# Patient Record
Sex: Female | Born: 1985 | Race: Black or African American | Hispanic: No | Marital: Single | Smoking: Current every day smoker
Health system: Southern US, Community
[De-identification: ages and names within clinical notes are randomized; demographics above are authoritative.]

## PROBLEM LIST (undated history)

## (undated) DIAGNOSIS — E119 Type 2 diabetes mellitus without complications: Secondary | ICD-10-CM

## (undated) HISTORY — PX: CERCLAGE REMOVAL: SUR1451

## (undated) HISTORY — PX: LAPAROSCOPIC ASSISTED VAGINAL HYSTERECTOMY: SHX5398

---

## 2015-12-01 ENCOUNTER — Encounter (HOSPITAL_COMMUNITY): Payer: Self-pay | Admitting: Neurology

## 2015-12-01 ENCOUNTER — Emergency Department (HOSPITAL_COMMUNITY): Payer: Self-pay

## 2015-12-01 ENCOUNTER — Emergency Department (HOSPITAL_COMMUNITY)
Admission: EM | Admit: 2015-12-01 | Discharge: 2015-12-01 | Disposition: A | Discharge status: Home / Self Care | Payer: Self-pay | Attending: Emergency Medicine | Admitting: Emergency Medicine

## 2015-12-01 DIAGNOSIS — F17200 Nicotine dependence, unspecified, uncomplicated: Secondary | ICD-10-CM | POA: Insufficient documentation

## 2015-12-01 DIAGNOSIS — R071 Chest pain on breathing: Secondary | ICD-10-CM | POA: Insufficient documentation

## 2015-12-01 DIAGNOSIS — R0789 Other chest pain: Secondary | ICD-10-CM

## 2015-12-01 DIAGNOSIS — Z791 Long term (current) use of non-steroidal anti-inflammatories (NSAID): Secondary | ICD-10-CM | POA: Insufficient documentation

## 2015-12-01 LAB — CBC
HCT: 38.6 % (ref 36.0–46.0)
Hemoglobin: 12.6 g/dL (ref 12.0–15.0)
MCH: 28 pg (ref 26.0–34.0)
MCHC: 32.6 g/dL (ref 30.0–36.0)
MCV: 85.8 fL (ref 78.0–100.0)
PLATELETS: 237 10*3/uL (ref 150–400)
RBC: 4.5 MIL/uL (ref 3.87–5.11)
RDW: 14.2 % (ref 11.5–15.5)
WBC: 4.1 10*3/uL (ref 4.0–10.5)

## 2015-12-01 LAB — BASIC METABOLIC PANEL
ANION GAP: 7 (ref 5–15)
BUN: 7 mg/dL (ref 6–20)
CALCIUM: 9.1 mg/dL (ref 8.9–10.3)
CO2: 27 mmol/L (ref 22–32)
CREATININE: 0.79 mg/dL (ref 0.44–1.00)
Chloride: 108 mmol/L (ref 101–111)
Glucose, Bld: 84 mg/dL (ref 65–99)
Potassium: 3.7 mmol/L (ref 3.5–5.1)
SODIUM: 142 mmol/L (ref 135–145)

## 2015-12-01 LAB — I-STAT TROPONIN, ED: TROPONIN I, POC: 0 ng/mL (ref 0.00–0.08)

## 2015-12-01 MED ORDER — NAPROXEN 500 MG PO TABS: 500.0000 mg | ORAL_TABLET | Freq: Two times a day (BID) | ORAL | Status: DC

## 2015-12-01 MED ORDER — CYCLOBENZAPRINE HCL 10 MG PO TABS: 10.0000 mg | ORAL_TABLET | Freq: Two times a day (BID) | ORAL | Status: DC | PRN

## 2015-12-01 NOTE — Discharge Instructions (Signed)
Your muscle relaxer can make you very drowsy - please do not drink or drive on this medication Follow up with PCP in 3 days for discussion of today's diagnosis.  Return to ER for any new or worsening symptoms, any additional concerns.  Chest Wall Pain Chest wall pain is pain in or around the bones and muscles of your chest. Sometimes, an injury causes this pain. Sometimes, the cause may not be known. This pain may take several weeks or longer to get better. HOME CARE INSTRUCTIONS  Pay attention to any changes in your symptoms. Take these actions to help with your pain:   Rest as told by your health care provider.   Avoid activities that cause pain. These include any activities that use your chest muscles or your abdominal and side muscles to lift heavy items.   If directed, apply ice to the painful area:  Put ice in a plastic bag.  Place a towel between your skin and the bag.  Leave the ice on for 20 minutes, 2-3 times per day.  Take over-the-counter and prescription medicines only as told by your health care provider.  Do not use tobacco products, including cigarettes, chewing tobacco, and e-cigarettes. If you need help quitting, ask your health care provider.  Keep all follow-up visits as told by your health care provider. This is important. SEEK MEDICAL CARE IF:  You have a fever.  Your chest pain becomes worse.  You have new symptoms. SEEK IMMEDIATE MEDICAL CARE IF:  You have nausea or vomiting.  You feel sweaty or light-headed.  You have a cough with phlegm (sputum) or you cough up blood.  You develop shortness of breath.   This information is not intended to replace advice given to you by your health care provider. Make sure you discuss any questions you have with your health care provider.   Document Released: 12/03/2005 Document Revised: 08/24/2015 Document Reviewed: 02/28/2015 Elsevier Interactive Patient Education [employer].

## 2015-12-01 NOTE — ED Provider Notes (Signed)
CSN: [id number]     Arrival date & time 12/01/15  1213 History   First MD Initiated Contact with Patient 12/01/15 1334     Chief Complaint  Patient presents with  . Chest Pain     (Consider location/radiation/quality/duration/timing/severity/associated sxs/prior Treatment) HPI   Kim Bauer is a 29 y.o. female  With no pertinent PMH who presents to the Emergency Department complaining of constant aching central chest pain that began last night. Denies radiation. Aggravated by movement/twisting. No alleviating factors noted, no medications taken PTA. Patient is a smoker, no other cardiac risk factors, no cardiac family history. Denies fever.   History reviewed. No pertinent past medical history. History reviewed. No pertinent past surgical history. No family history on file. Social History  Substance Use Topics  . Smoking status: Current Every Day Smoker  . Smokeless tobacco: None  . Alcohol Use: No   OB History    No data available     Review of Systems  Constitutional: Negative.   HENT: Negative for congestion, rhinorrhea and sore throat.   Eyes: Negative for visual disturbance.  Respiratory: Negative for cough, shortness of breath and wheezing.   Cardiovascular: Positive for chest pain. Negative for palpitations and leg swelling.  Gastrointestinal: Negative for nausea, vomiting, abdominal pain, diarrhea and constipation.  Musculoskeletal: Negative for back pain, neck pain and neck stiffness.  Skin: Negative for rash.  Neurological: Negative for dizziness, weakness and headaches.  Hematological: Does not bruise/bleed easily.      Allergies  Review of patient's allergies indicates no known allergies.  Home Medications   Prior to Admission medications   Medication Sig Start Date End Date Taking? Authorizing Provider  cyclobenzaprine (FLEXERIL) 10 MG tablet Take 1 tablet (10 mg total) by mouth 2 (two) times daily as needed for muscle spasms. 12/01/15   Ozella Almond Ward, PA-C  naproxen (NAPROSYN) 500 MG tablet Take 1 tablet (500 mg total) by mouth 2 (two) times daily. 12/01/15   Jaime Pilcher Ward, PA-C   BP 142/71 mmHg  Pulse 71  Temp(Src) 98.5 F (36.9 C) (Oral)  Resp 16  Ht 5\' 5"  (1.651 m)  Wt 95.255 kg  BMI 34.95 kg/m2  SpO2 100%  LMP 11/24/2015 Physical Exam  Constitutional: She is oriented to person, place, and time. She appears well-developed and well-nourished.  Alert and in no acute distress  HENT:  Head: Normocephalic and atraumatic.  Cardiovascular: Normal rate, regular rhythm, normal heart sounds and intact distal pulses.  Exam reveals no gallop and no friction rub.   No murmur heard. Pulmonary/Chest: Effort normal and breath sounds normal. No respiratory distress. She has no wheezes. She has no rales.    Abdominal: She exhibits no mass. There is no rebound and no guarding.  Abdomen soft, non-tender, non-distended Bowel sounds positive in all four quadrants  Musculoskeletal: She exhibits no edema.  Neurological: She is alert and oriented to person, place, and time.  Skin: Skin is warm and dry. No rash noted.  Psychiatric: She has a normal mood and affect. Her behavior is normal. Judgment and thought content normal.  Nursing note and vitals reviewed.   ED Course  Procedures (including critical care time) Labs Review Labs Reviewed  BASIC METABOLIC PANEL  CBC  I-STAT [place], ED    Imaging Review Dg Chest 2 View  12/01/2015  CLINICAL DATA:  Chest pain and shortness of breast since yesterday, smoker EXAM: CHEST  2 VIEW COMPARISON:  None FINDINGS: Normal heart size, mediastinal contours, and pulmonary  vascularity. Lungs clear. No pneumothorax. Bones unremarkable. IMPRESSION: Normal exam. Electronically Signed   By: Lavonia Dana M.D.   On: 12/01/2015 13:22   I have personally reviewed and evaluated these images and lab results as part of my medical decision-making.   EKG Interpretation None      MDM   Final  diagnoses:  Costochondral chest pain   Kim Bauer presents with chest pain worse with movement and tender to the touch. Negative trop, normal CXR; all labs wdl. Cardiopulm etiology unlikely.  A&P: Costochondritis  - Naproxen, muscle relaxer, symptomatic care  - PCP follow up, return precautions given.   [facility] Ward, PA-C 12/01/15 1411  Quintella Reichert, MD 12/01/15 [phone]

## 2015-12-01 NOTE — ED Notes (Signed)
Pt reports sharp cp since yesterday that got worse today. Has middle cp, is constant. Denies n/v. Denies cardiac hx.

## 2016-11-29 ENCOUNTER — Emergency Department (HOSPITAL_COMMUNITY): Payer: BLUE CROSS/BLUE SHIELD

## 2016-11-29 ENCOUNTER — Encounter (HOSPITAL_COMMUNITY): Payer: Self-pay

## 2016-11-29 ENCOUNTER — Emergency Department (HOSPITAL_COMMUNITY)
Admission: EM | Admit: 2016-11-29 | Discharge: 2016-11-29 | Disposition: A | Discharge status: Home / Self Care | Payer: BLUE CROSS/BLUE SHIELD | Attending: Emergency Medicine | Admitting: Emergency Medicine

## 2016-11-29 DIAGNOSIS — Y92410 Unspecified street and highway as the place of occurrence of the external cause: Secondary | ICD-10-CM | POA: Insufficient documentation

## 2016-11-29 DIAGNOSIS — R0789 Other chest pain: Secondary | ICD-10-CM | POA: Diagnosis not present

## 2016-11-29 DIAGNOSIS — Z23 Encounter for immunization: Secondary | ICD-10-CM | POA: Diagnosis not present

## 2016-11-29 DIAGNOSIS — M25512 Pain in left shoulder: Secondary | ICD-10-CM | POA: Diagnosis not present

## 2016-11-29 DIAGNOSIS — Y939 Activity, unspecified: Secondary | ICD-10-CM | POA: Insufficient documentation

## 2016-11-29 DIAGNOSIS — S60811A Abrasion of right wrist, initial encounter: Secondary | ICD-10-CM | POA: Insufficient documentation

## 2016-11-29 DIAGNOSIS — M545 Low back pain: Secondary | ICD-10-CM | POA: Insufficient documentation

## 2016-11-29 DIAGNOSIS — Y999 Unspecified external cause status: Secondary | ICD-10-CM | POA: Diagnosis not present

## 2016-11-29 LAB — I-STAT BETA HCG BLOOD, ED (MC, WL, AP ONLY)

## 2016-11-29 MED ORDER — NAPROXEN 500 MG PO TABS: 500.0000 mg | ORAL_TABLET | Freq: Two times a day (BID) | ORAL | 0 refills | Status: DC

## 2016-11-29 MED ORDER — CYCLOBENZAPRINE HCL 5 MG PO TABS: 5.0000 mg | ORAL_TABLET | Freq: Three times a day (TID) | ORAL | 0 refills | Status: DC | PRN

## 2016-11-29 MED ORDER — CYCLOBENZAPRINE HCL 10 MG PO TABS
5.0000 mg | ORAL_TABLET | Freq: Once | ORAL | Status: AC
  Administered 2016-11-29: 5 mg via ORAL
  Filled 2016-11-29: qty 1

## 2016-11-29 MED ORDER — TETANUS-DIPHTH-ACELL PERTUSSIS 5-2.5-18.5 LF-MCG/0.5 IM SUSP
0.5000 mL | Freq: Once | INTRAMUSCULAR | Status: AC
  Administered 2016-11-29: 0.5 mL via INTRAMUSCULAR
  Filled 2016-11-29: qty 0.5

## 2016-11-29 MED ORDER — ACETAMINOPHEN 325 MG PO TABS
650.0000 mg | ORAL_TABLET | Freq: Once | ORAL | Status: AC
  Administered 2016-11-29: 650 mg via ORAL
  Filled 2016-11-29: qty 2
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
## 2016-11-29 NOTE — Discharge Instructions (Signed)
Read the information below.  Your x-rays were re-assuring. You were provided a knee sleeve and ankle brace and crutches for symptomatic relief. Use for the next 2-3 days. You may feel sore for the next 2-3 days. I have prescribed naprosyn and flexeril for relief. While taking naprosyn do not take other NSAIDs (ibuprofen, motrin, or aleve). Flexeril can make you drowsy, do not drive after taking.  You can apply heat/ice to affected areas for 20 minute increments.  Warm showers can soothe sore muscles.  If symptoms persist for more than a week follow up with your primary provider. I have provided the contact information for [facility] and [facility] if you do not have a regular provider.  Use the prescribed medication as directed.  Please discuss all new medications with your pharmacist.   You may return to the Emergency Department at any time for worsening condition or any new symptoms that concern you.

## 2017-12-03 NOTE — H&P (Signed)
Patient name Kim Bauer, Kim Bauer DICTATION# [id number] CSN# [id number]  [facility], MD 12/03/2017 5:06 AM

## 2017-12-04 NOTE — H&P (Signed)
Kim Bauer, ROBBEN NO.:  [id number]  MEDICAL RECORD NO.:  [id number]  LOCATION:                                 FACILITY:  PHYSICIAN:  Darlyn Chamber, M.D.        DATE OF BIRTH:  DATE OF ADMISSION: DATE OF DISCHARGE:                             HISTORY & PHYSICAL   DATE OF SURGERY:  December 23, 2017, at [facility] here in [place].  HISTORY OF PRESENT ILLNESS:  In relation to the present admission, the patient is a 31 year old, gravida 3, para 2 female, presents for laparoscopic-assisted vaginal hysterectomy.  In relation to the present admission, the patient has a history of abnormal cervical cytology.  She had previous cryotherapy.  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  Colposcopic-directed biopsy at that time revealed moderate dysplasia with high-grade lesions.  She underwent LEEP of the cervix in July 2017, that revealed koilocytic atypia. Subsequently, again, she had recurrent abnormal Pap smear with severe changes.  Biopsies came back completely negative.  We repeated the LEEP, no dysplasia was noted.  This was back earlier this year.  They reviewed the whole specimen, could not come with anything that was concomitant with cytology.  Subsequently, again, she had abnormal Pap smear later this year.  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  We repeated the LEEP on her that revealed diffuse low-grade dysplasia.  In view of the persistent abnormalities and the lack of concurrence between the cytology and histology, we decided to proceed with laparoscopic-assisted vaginal hysterectomy.  Other alternatives such as watching certainly have been discussed.  ALLERGIES:  In terms of allergies, she has no known drug allergies.  MEDICATIONS:  None.  PAST MEDICAL HISTORY:  Usual childhood diseases.  No significant sequelae.  She has had 2 vaginal deliveries.  SOCIAL HISTORY:  No tobacco or  alcohol use.  FAMILY HISTORY:  Noncontributory.  REVIEW OF SYSTEMS:  Noncontributory.  PHYSICAL EXAMINATION:  VITAL SIGNS:  The patient is afebrile.  Stable vital signs.  HEENT:  The patient is normocephalic.  Pupils equal, round, and reactive to light and accommodation.  Extraocular movements are intact.  Sclerae and conjunctivae clear.  Oropharynx clear. NECK:  Without thyromegaly. BREASTS:  No discrete masses. LUNGS:  Clear. CARDIOVASCULAR SYSTEM:  Regular rate without murmurs or gallops.  No carotid or abdominal bruits. ABDOMEN:  Benign.  No mass, organomegaly, or tenderness. PELVIC:  Normal external genitalia.  Vaginal mucosa is clear.  Cervix unremarkable.  Uterus of normal size, shape, and contour.  Adnexa free of mass or tenderness. EXTREMITIES:  Trace edema. NEUROLOGIC:  Grossly within normal limits.  IMPRESSION:  Persistent abnormal cervical cytology despite numerous management.  PLAN:  The patient will undergo a laparoscopic-assisted vaginal hysterectomy with removal of both fallopian tubes.  The risks of surgery have been discussed including the risk of infection.  Risk of hemorrhage that could require transfusion with the risk of AIDS or hepatitis.  Risk of injury to adjacent organs including bladder, bowel, ureters that could require further exploratory surgery.  Risk of deep venous thrombosis and pulmonary embolus.  The patient expressed understanding of potential risks and complications.  Darlyn Chamber, M.D.     JSM/MEDQ  D:  12/03/2017  T:  12/04/2017  Job:  [id number]

## 2017-12-13 ENCOUNTER — Encounter (HOSPITAL_COMMUNITY): Payer: Self-pay

## 2017-12-13 NOTE — Patient Instructions (Signed)
Kim Bauer  12/13/2017      Your procedure is scheduled on:  Monday, Jan. 7, 2019   Report to [facility]  At  5:30 A.M.   Call this number if you have problems the morning of [phone]              OUR ADDRESS IS [address] , WE ARE LOCATED IN [place].    Remember:  Do not eat food or drink liquids after midnight.   Take these medicines the morning of surgery with A SIP OF WATER  None   Do not wear jewelry, make-up or nail polish.  Do not wear lotions, powders, or perfumes, or deoderant.  Do not shave 48 hours prior to surgery.    Do not bring valuables to the hospital.  [facility] is not responsible for any belongings or valuables.  Contacts, dentures or bridgework may not be worn into surgery.  Leave your suitcase in the car.  After surgery it may be brought to your room.  For patients admitted to the hospital, discharge time will be determined by your treatment team.   Special instructions:    Please read over the following fact sheets that you were given.    [facility] - Preparing for Surgery Before surgery, you can play an important role.  Because skin is not sterile, your skin needs to be as free of germs as possible.  You can reduce the number of germs on your skin by washing with CHG (chlorahexidine gluconate) soap before surgery.  CHG is an antiseptic cleaner which kills germs and bonds with the skin to continue killing germs even after washing. Please DO NOT use if you have an allergy to CHG or antibacterial soaps.  If your skin becomes reddened/irritated stop using the CHG and inform your nurse when you arrive at Short Stay. Do not shave (including legs and underarms) for at least 48 hours prior to the first CHG shower.  You may shave your face/neck.  Please follow these instructions carefully:  1.  Shower with CHG Soap the night before surgery and the  morning of surgery.  2.  If you  choose to wash your hair, wash your hair first as usual with your normal  shampoo.  3.  After you shampoo, rinse your hair and body thoroughly to remove the shampoo.                             4.  Use CHG as you would any other liquid soap.  You can apply chg directly to the skin and wash.  Gently with a scrungie or clean washcloth.  5.  Apply the CHG Soap to your body ONLY FROM THE NECK DOWN.   Do   not use on face/ open                           Wound or open sores. Avoid contact with eyes, ears mouth and   genitals (private parts).                       Wash face,  Genitals (private parts) with your normal soap.             6.  Wash thoroughly, paying  special attention to the area where your    surgery  will be performed.  7.  Thoroughly rinse your body with warm water from the neck down.  8.  DO NOT shower/wash with your normal soap after using and rinsing off the CHG Soap.                9.  Pat yourself dry with a clean towel.            10.  Wear clean pajamas.            11.  Place clean sheets on your bed the night of your first shower and do not  sleep with pets. Day of Surgery : Do not apply any lotions/deodorants the morning of surgery.  Please wear clean clothes to the hospital/surgery center.  FAILURE TO FOLLOW THESE INSTRUCTIONS MAY RESULT IN THE CANCELLATION OF YOUR SURGERY  PATIENT SIGNATURE_________________________________  NURSE SIGNATURE__________________________________  ________________________________________________________________________   Adam Phenix  An incentive spirometer is a tool that can help keep your lungs clear and active. This tool measures how well you are filling your lungs with each breath. Taking long deep breaths may help reverse or decrease the chance of developing breathing (pulmonary) problems (especially infection) following:  A long period of time when you are unable to move or be active. BEFORE THE PROCEDURE   If the spirometer  includes an indicator to show your best effort, your nurse or respiratory therapist will set it to a desired goal.  If possible, sit up straight or lean slightly forward. Try not to slouch.  Hold the incentive spirometer in an upright position. INSTRUCTIONS FOR USE  1. Sit on the edge of your bed if possible, or sit up as far as you can in bed or on a chair. 2. Hold the incentive spirometer in an upright position. 3. Breathe out normally. 4. Place the mouthpiece in your mouth and seal your lips tightly around it. 5. Breathe in slowly and as deeply as possible, raising the piston or the ball toward the top of the column. 6. Hold your breath for 3-5 seconds or for as long as possible. Allow the piston or ball to fall to the bottom of the column. 7. Remove the mouthpiece from your mouth and breathe out normally. 8. Rest for a few seconds and repeat Steps 1 through 7 at least 10 times every 1-2 hours when you are awake. Take your time and take a few normal breaths between deep breaths. 9. The spirometer may include an indicator to show your best effort. Use the indicator as a goal to work toward during each repetition. 10. After each set of 10 deep breaths, practice coughing to be sure your lungs are clear. If you have an incision (the cut made at the time of surgery), support your incision when coughing by placing a pillow or rolled up towels firmly against it. Once you are able to get out of bed, walk around indoors and cough well. You may stop using the incentive spirometer when instructed by your caregiver.  RISKS AND COMPLICATIONS  Take your time so you do not get dizzy or light-headed.  If you are in pain, you may need to take or ask for pain medication before doing incentive spirometry. It is harder to take a deep breath if you are having pain. AFTER USE  Rest and breathe slowly and easily.  It can be helpful to keep track of a log of your progress. Your  caregiver can provide you with a  simple table to help with this. If you are using the spirometer at home, follow these instructions: [place] IF:   You are having difficultly using the spirometer.  You have trouble using the spirometer as often as instructed.  Your pain medication is not giving enough relief while using the spirometer.  You develop fever of 100.5 F (38.1 C) or higher. SEEK IMMEDIATE MEDICAL CARE IF:   You cough up bloody sputum that had not been present before.  You develop fever of 102 F (38.9 C) or greater.  You develop worsening pain at or near the incision site. MAKE SURE YOU:   Understand these instructions.  Will watch your condition.  Will get help right away if you are not doing well or get worse. Document Released: 04/15/2007 Document Revised: 02/25/2012 Document Reviewed: 06/16/2007 Exit[employer] Patient Information 2014 [employer], [place].   ________________________________________________________________________  WHAT IS A BLOOD TRANSFUSION? Blood Transfusion Information  A transfusion is the replacement of blood or some of its parts. Blood is made up of multiple cells which provide different functions.  Red blood cells carry oxygen and are used for blood loss replacement.  White blood cells fight against infection.  Platelets control bleeding.  Plasma helps clot blood.  Other blood products are available for specialized needs, such as hemophilia or other clotting disorders. BEFORE THE TRANSFUSION  Who gives blood for transfusions?   Healthy volunteers who are fully evaluated to make sure their blood is safe. This is blood bank blood. Transfusion therapy is the safest it has ever been in the practice of medicine. Before blood is taken from a donor, a complete history is taken to make sure that person has no history of diseases nor engages in risky social behavior (examples are intravenous drug use or sexual activity with multiple partners). The donor's travel history  is screened to minimize risk of transmitting infections, such as malaria. The donated blood is tested for signs of infectious diseases, such as HIV and hepatitis. The blood is then tested to be sure it is compatible with you in order to minimize the chance of a transfusion reaction. If you or a relative donates blood, this is often done in anticipation of surgery and is not appropriate for emergency situations. It takes many days to process the donated blood. RISKS AND COMPLICATIONS Although transfusion therapy is very safe and saves many lives, the main dangers of transfusion include:   Getting an infectious disease.  Developing a transfusion reaction. This is an allergic reaction to something in the blood you were given. Every precaution is taken to prevent this. The decision to have a blood transfusion has been considered carefully by your caregiver before blood is given. Blood is not given unless the benefits outweigh the risks. AFTER THE TRANSFUSION  Right after receiving a blood transfusion, you will usually feel much better and more energetic. This is especially true if your red blood cells have gotten low (anemic). The transfusion raises the level of the red blood cells which carry oxygen, and this usually causes an energy increase.  The nurse administering the transfusion will monitor you carefully for complications. HOME CARE INSTRUCTIONS  No special instructions are needed after a transfusion. You may find your energy is better. Speak with your caregiver about any limitations on activity for underlying diseases you may have. SEEK MEDICAL CARE IF:   Your condition is not improving after your transfusion.  You develop redness or irritation at  the intravenous (IV) site. SEEK IMMEDIATE MEDICAL CARE IF:  Any of the following symptoms occur over the next 12 hours:  Shaking chills.  You have a temperature by mouth above 102 F (38.9 C), not controlled by medicine.  Chest, back, or  muscle pain.  People around you feel you are not acting correctly or are confused.  Shortness of breath or difficulty breathing.  Dizziness and fainting.  You get a rash or develop hives.  You have a decrease in urine output.  Your urine turns a dark color or changes to pink, red, or brown. Any of the following symptoms occur over the next 10 days:  You have a temperature by mouth above 102 F (38.9 C), not controlled by medicine.  Shortness of breath.  Weakness after normal activity.  The white part of the eye turns yellow (jaundice).  You have a decrease in the amount of urine or are urinating less often.  Your urine turns a dark color or changes to pink, red, or brown. Document Released: 11/30/2000 Document Revised: 02/25/2012 Document Reviewed: 07/19/2008 [facility] Patient Information 2014 [place], [place].  _______________________________________________________________________

## 2017-12-18 ENCOUNTER — Encounter (HOSPITAL_COMMUNITY)
Admission: RE | Admit: 2017-12-18 | Discharge: 2017-12-18 | Disposition: A | Discharge status: Home / Self Care | Payer: BLUE CROSS/BLUE SHIELD | Source: Ambulatory Visit | Attending: Obstetrics and Gynecology | Admitting: Obstetrics and Gynecology

## 2017-12-18 NOTE — Pre-Procedure Instructions (Signed)
Left message on Ms. Kim Bauer voicemail to check the status of her arrival to her pre op appointment today.

## 2017-12-19 ENCOUNTER — Encounter (HOSPITAL_COMMUNITY): Payer: Self-pay

## 2017-12-19 ENCOUNTER — Other Ambulatory Visit: Payer: Self-pay

## 2017-12-19 ENCOUNTER — Encounter (HOSPITAL_COMMUNITY)
Admission: RE | Admit: 2017-12-19 | Discharge: 2017-12-19 | Disposition: A | Discharge status: Home / Self Care | Payer: BLUE CROSS/BLUE SHIELD | Source: Ambulatory Visit | Attending: Obstetrics and Gynecology | Admitting: Obstetrics and Gynecology

## 2017-12-19 DIAGNOSIS — N879 Dysplasia of cervix uteri, unspecified: Secondary | ICD-10-CM | POA: Insufficient documentation

## 2017-12-19 DIAGNOSIS — Z01812 Encounter for preprocedural laboratory examination: Secondary | ICD-10-CM | POA: Diagnosis present

## 2017-12-19 LAB — CBC
HCT: 38.1 % (ref 36.0–46.0)
HEMOGLOBIN: 12.5 g/dL (ref 12.0–15.0)
MCH: 28.4 pg (ref 26.0–34.0)
MCHC: 32.8 g/dL (ref 30.0–36.0)
MCV: 86.6 fL (ref 78.0–100.0)
Platelets: 241 10*3/uL (ref 150–400)
RBC: 4.4 MIL/uL (ref 3.87–5.11)
RDW: 14.3 % (ref 11.5–15.5)
WBC: 5 10*3/uL (ref 4.0–10.5)

## 2017-12-19 LAB — ABO/RH: ABO/RH(D): A POS

## 2017-12-19 NOTE — Patient Instructions (Signed)
Kim Bauer  12/19/2017      Your procedure is scheduled on 12-23-17  Report to [facility]  Call this number if you have problems the morning of surgery:[phone]             OUR ADDRESS IS [address] , WE ARE LOCATED IN [place].    Remember:  Do not eat food or drink liquids after midnight.    Take these medicines the morning of surgery with A SIP OF WATER  None                      Kim Bauer - Preparing for Surgery  Before surgery, you can play an important role.  Because skin is not sterile, your skin needs to be as free of germs as possible.  You can reduce the number of germs on your skin by washing with CHG (chlorahexidine gluconate) soap before surgery.  CHG is an antiseptic cleaner which kills germs and bonds with the skin to continue killing germs even after washing. Please DO NOT use if you have an allergy to CHG or antibacterial soaps.  If your skin becomes reddened/irritated stop using the CHG and inform your nurse when you arrive at Short Stay. Do not shave (including legs and underarms) for at least 48 hours prior to the first CHG shower.  You may shave your face/neck. Please follow these instructions carefully:  1.  Shower with CHG Soap the night before surgery and the  morning of Surgery.  2.  If you choose to wash your hair, wash your hair first as usual with your  normal  shampoo.  3.  After you shampoo, rinse your hair and body thoroughly to remove the  shampoo.                           4.  Use CHG as you would any other liquid soap.  You can apply chg directly  to the skin and wash                       Gently with a scrungie or clean washcloth.  5.  Apply the CHG Soap to your body ONLY FROM THE NECK DOWN.   Do not use on face/ open                           Wound or open sores. Avoid contact with eyes, ears mouth and genitals (private parts).                       Wash face,  Genitals  (private parts) with your normal soap.             6.  Wash thoroughly, paying special attention to the area where your surgery  will be performed.  7.  Thoroughly rinse your body with warm water from the neck down.  8.  DO NOT shower/wash with your normal soap after using and rinsing off  the CHG Soap.                9.  Pat yourself dry with a clean towel.            10.  Wear clean pajamas.  11.  Place clean sheets on your bed the night of your first shower and do not  sleep with pets. Day of Surgery : Do not apply any lotions/deodorants the morning of surgery.  Please wear clean clothes to the hospital/surgery center.  FAILURE TO FOLLOW THESE INSTRUCTIONS MAY RESULT IN THE CANCELLATION OF YOUR SURGERY PATIENT SIGNATURE_________________________________  NURSE SIGNATURE__________________________________  ________________________________________________________________________   Do not wear jewelry, make-up or nail polish.  Do not wear lotions, powders, or perfumes, or deoderant.  Do not shave 48 hours prior to surgery.  Men may shave face and neck.  Do not bring valuables to the hospital.  [facility] is not responsible for any belongings or valuables.  Contacts, dentures or bridgework may not be worn into surgery.  Leave your suitcase in the car.  After surgery it may be brought to your room.  For patients admitted to the hospital, discharge time will be determined by your treatment team.   Special instructions:   Please read over the following fact sheets that you were given.

## 2017-12-23 ENCOUNTER — Ambulatory Visit (HOSPITAL_BASED_OUTPATIENT_CLINIC_OR_DEPARTMENT_OTHER): Payer: BLUE CROSS/BLUE SHIELD | Admitting: Anesthesiology

## 2017-12-23 ENCOUNTER — Other Ambulatory Visit: Payer: Self-pay

## 2017-12-23 ENCOUNTER — Encounter (HOSPITAL_BASED_OUTPATIENT_CLINIC_OR_DEPARTMENT_OTHER): Payer: Self-pay | Admitting: *Deleted

## 2017-12-23 ENCOUNTER — Observation Stay (HOSPITAL_BASED_OUTPATIENT_CLINIC_OR_DEPARTMENT_OTHER)
Admission: RE | Admit: 2017-12-23 | Discharge: 2017-12-24 | Disposition: A | Discharge status: Home / Self Care | Payer: BLUE CROSS/BLUE SHIELD | Source: Ambulatory Visit | Attending: Obstetrics and Gynecology | Admitting: Obstetrics and Gynecology

## 2017-12-23 ENCOUNTER — Encounter (HOSPITAL_BASED_OUTPATIENT_CLINIC_OR_DEPARTMENT_OTHER)
Admission: RE | Disposition: A | Discharge status: Home / Self Care | Payer: Self-pay | Source: Ambulatory Visit | Attending: Obstetrics and Gynecology

## 2017-12-23 DIAGNOSIS — N879 Dysplasia of cervix uteri, unspecified: Secondary | ICD-10-CM | POA: Diagnosis present

## 2017-12-23 DIAGNOSIS — Z90710 Acquired absence of both cervix and uterus: Secondary | ICD-10-CM

## 2017-12-23 DIAGNOSIS — D282 Benign neoplasm of uterine tubes and ligaments: Secondary | ICD-10-CM | POA: Insufficient documentation

## 2017-12-23 DIAGNOSIS — N871 Moderate cervical dysplasia: Principal | ICD-10-CM | POA: Insufficient documentation

## 2017-12-23 HISTORY — PX: LAPAROSCOPIC VAGINAL HYSTERECTOMY WITH SALPINGECTOMY: SHX6680

## 2017-12-23 HISTORY — DX: Acquired absence of both cervix and uterus: Z90.710

## 2017-12-23 LAB — TYPE AND SCREEN
ABO/RH(D): A POS
ANTIBODY SCREEN: NEGATIVE

## 2017-12-23 LAB — HCG, SERUM, QUALITATIVE: PREG SERUM: NEGATIVE

## 2017-12-23 SURGERY — HYSTERECTOMY, VAGINAL, LAPAROSCOPY-ASSISTED, WITH SALPINGECTOMY
Anesthesia: General | Laterality: Bilateral

## 2017-12-23 MED ORDER — MEPERIDINE HCL 25 MG/ML IJ SOLN
6.2500 mg | INTRAMUSCULAR | Status: DC | PRN
  Filled 2017-12-23: qty 1

## 2017-12-23 MED ORDER — BUPIVACAINE HCL (PF) 0.25 % IJ SOLN
INTRAMUSCULAR | Status: DC | PRN
  Administered 2017-12-23: 7 mL

## 2017-12-23 MED ORDER — ONDANSETRON HCL 4 MG/2ML IJ SOLN
INTRAMUSCULAR | Status: AC
  Filled 2017-12-23: qty 2

## 2017-12-23 MED ORDER — MIDAZOLAM HCL 2 MG/2ML IJ SOLN
0.5000 mg | Freq: Once | INTRAMUSCULAR | Status: DC | PRN
  Filled 2017-12-23: qty 2

## 2017-12-23 MED ORDER — FENTANYL CITRATE (PF) 100 MCG/2ML IJ SOLN
INTRAMUSCULAR | Status: DC | PRN
  Administered 2017-12-23 (×3): 50 ug via INTRAVENOUS
  Administered 2017-12-23: 100 ug via INTRAVENOUS
  Administered 2017-12-23: 25 ug via INTRAVENOUS

## 2017-12-23 MED ORDER — ONDANSETRON HCL 4 MG/2ML IJ SOLN
4.0000 mg | Freq: Four times a day (QID) | INTRAMUSCULAR | Status: DC | PRN
  Filled 2017-12-23: qty 2

## 2017-12-23 MED ORDER — FENTANYL CITRATE (PF) 100 MCG/2ML IJ SOLN
INTRAMUSCULAR | Status: AC
  Filled 2017-12-23: qty 2

## 2017-12-23 MED ORDER — DEXAMETHASONE SODIUM PHOSPHATE 10 MG/ML IJ SOLN
INTRAMUSCULAR | Status: DC | PRN
  Administered 2017-12-23: 10 mg via INTRAVENOUS

## 2017-12-23 MED ORDER — SUGAMMADEX SODIUM 200 MG/2ML IV SOLN
INTRAVENOUS | Status: AC
Start: 2017-12-23 — End: 2017-12-23
  Filled 2017-12-23: qty 2

## 2017-12-23 MED ORDER — PROMETHAZINE HCL 25 MG/ML IJ SOLN
6.2500 mg | INTRAMUSCULAR | Status: AC | PRN
  Administered 2017-12-23 (×2): 6.25 mg via INTRAVENOUS
  Filled 2017-12-23: qty 1

## 2017-12-23 MED ORDER — LIDOCAINE 2% (20 MG/ML) 5 ML SYRINGE
INTRAMUSCULAR | Status: DC | PRN
  Administered 2017-12-23: 40 mg via INTRAVENOUS

## 2017-12-23 MED ORDER — OXYCODONE-ACETAMINOPHEN 5-325 MG PO TABS
1.0000 | ORAL_TABLET | ORAL | Status: DC | PRN
  Administered 2017-12-23 – 2017-12-24 (×3): 1 via ORAL
  Filled 2017-12-23: qty 1

## 2017-12-23 MED ORDER — SUGAMMADEX SODIUM 200 MG/2ML IV SOLN
INTRAVENOUS | Status: DC | PRN
  Administered 2017-12-23: 10 mg via INTRAVENOUS

## 2017-12-23 MED ORDER — PROMETHAZINE HCL 25 MG/ML IJ SOLN
INTRAMUSCULAR | Status: AC
  Filled 2017-12-23: qty 1

## 2017-12-23 MED ORDER — KETOROLAC TROMETHAMINE 30 MG/ML IJ SOLN
INTRAMUSCULAR | Status: AC
  Filled 2017-12-23: qty 1

## 2017-12-23 MED ORDER — LACTATED RINGERS IV SOLN
INTRAVENOUS | Status: DC
  Administered 2017-12-23 – 2017-12-24 (×3): via INTRAVENOUS
  Filled 2017-12-23 (×4): qty 1000

## 2017-12-23 MED ORDER — NALOXONE HCL 0.4 MG/ML IJ SOLN
0.4000 mg | INTRAMUSCULAR | Status: DC | PRN
  Filled 2017-12-23: qty 1

## 2017-12-23 MED ORDER — LIDOCAINE 2% (20 MG/ML) 5 ML SYRINGE
INTRAMUSCULAR | Status: AC
Start: 2017-12-23 — End: 2017-12-23
  Filled 2017-12-23: qty 5

## 2017-12-23 MED ORDER — ROCURONIUM BROMIDE 50 MG/5ML IV SOSY
PREFILLED_SYRINGE | INTRAVENOUS | Status: AC
  Filled 2017-12-23: qty 5

## 2017-12-23 MED ORDER — SCOPOLAMINE 1 MG/3DAYS TD PT72SCOPOLAMINE 1 MG/3DAYS
1.0000 | MEDICATED_PATCH | Freq: Once | TRANSDERMAL | Status: AC
Start: 2017-12-23 — End: 2017-12-23
  Administered 2017-12-23: 1 via TRANSDERMAL
  Filled 2017-12-23: qty 1

## 2017-12-23 MED ORDER — MIDAZOLAM HCL 2 MG/2ML IJ SOLN
INTRAMUSCULAR | Status: AC
  Filled 2017-12-23: qty 2

## 2017-12-23 MED ORDER — MENTHOL 3 MG MT LOZG
1.0000 | LOZENGE | OROMUCOSAL | Status: DC | PRN
  Filled 2017-12-23: qty 9

## 2017-12-23 MED ORDER — PROPOFOL 10 MG/ML IV BOLUS
INTRAVENOUS | Status: DC | PRN
  Administered 2017-12-23: 190 mg via INTRAVENOUS

## 2017-12-23 MED ORDER — HYDROMORPHONE HCL 1 MG/ML IJ SOLN
0.2500 mg | INTRAMUSCULAR | Status: DC | PRN
  Administered 2017-12-23 (×2): 0.5 mg via INTRAVENOUS
  Filled 2017-12-23: qty 0.5

## 2017-12-23 MED ORDER — ONDANSETRON HCL 4 MG/2ML IJ SOLN
INTRAMUSCULAR | Status: DC | PRN
  Administered 2017-12-23: 4 mg via INTRAVENOUS

## 2017-12-23 MED ORDER — SCOPOLAMINE 1 MG/3DAYS TD PT72
MEDICATED_PATCH | TRANSDERMAL | Status: AC
  Filled 2017-12-23: qty 1

## 2017-12-23 MED ORDER — HYDROMORPHONE HCL 1 MG/ML IJ SOLN
INTRAMUSCULAR | Status: AC
  Filled 2017-12-23: qty 1

## 2017-12-23 MED ORDER — ACETAMINOPHEN 325 MG PO TABS
650.0000 mg | ORAL_TABLET | ORAL | Status: DC | PRN
  Filled 2017-12-23: qty 2

## 2017-12-23 MED ORDER — LACTATED RINGERS IV SOLN
INTRAVENOUS | Status: DC
  Administered 2017-12-23 (×2): via INTRAVENOUS
  Filled 2017-12-23: qty 1000

## 2017-12-23 MED ORDER — DEXAMETHASONE SODIUM PHOSPHATE 10 MG/ML IJ SOLN
INTRAMUSCULAR | Status: AC
  Filled 2017-12-23: qty 1

## 2017-12-23 MED ORDER — OXYCODONE-ACETAMINOPHEN 5-325 MG PO TABS
ORAL_TABLET | ORAL | Status: AC
  Filled 2017-12-23: qty 1

## 2017-12-23 MED ORDER — ONDANSETRON HCL 4 MG PO TABS
4.0000 mg | ORAL_TABLET | Freq: Four times a day (QID) | ORAL | Status: DC | PRN
  Filled 2017-12-23: qty 1

## 2017-12-23 MED ORDER — ROCURONIUM BROMIDE 50 MG/5ML IV SOSY
PREFILLED_SYRINGE | INTRAVENOUS | Status: DC | PRN
  Administered 2017-12-23: 50 mg via INTRAVENOUS
  Administered 2017-12-23: 10 mg via INTRAVENOUS

## 2017-12-23 MED ORDER — SODIUM CHLORIDE 0.9% FLUSH
9.0000 mL | INTRAVENOUS | Status: DC | PRN
  Filled 2017-12-23: qty 10

## 2017-12-23 MED ORDER — SODIUM CHLORIDE 0.9 % IJ SOLN
INTRAMUSCULAR | Status: AC
  Filled 2017-12-23: qty 10

## 2017-12-23 MED ORDER — FENTANYL CITRATE (PF) 250 MCG/5ML IJ SOLN
INTRAMUSCULAR | Status: AC
  Filled 2017-12-23: qty 5

## 2017-12-23 MED ORDER — CEFAZOLIN SODIUM-DEXTROSE 2-4 GM/100ML-% IV SOLN
2.0000 g | INTRAVENOUS | Status: AC
  Administered 2017-12-23: 2 g via INTRAVENOUS
  Filled 2017-12-23: qty 100

## 2017-12-23 MED ORDER — CEFAZOLIN SODIUM-DEXTROSE 2-4 GM/100ML-% IV SOLN
INTRAVENOUS | Status: AC
  Filled 2017-12-23: qty 100

## 2017-12-23 MED ORDER — PROPOFOL 10 MG/ML IV BOLUS
INTRAVENOUS | Status: AC
  Filled 2017-12-23: qty 20

## 2017-12-23 MED ORDER — KETOROLAC TROMETHAMINE 30 MG/ML IJ SOLN
INTRAMUSCULAR | Status: DC | PRN
  Administered 2017-12-23: 30 mg via INTRAVENOUS

## 2017-12-23 MED ORDER — MIDAZOLAM HCL 5 MG/5ML IJ SOLN
INTRAMUSCULAR | Status: DC | PRN
  Administered 2017-12-23: 2 mg via INTRAVENOUS

## 2017-12-23 MED ORDER — ONDANSETRON HCL 4 MG/2ML IJ SOLN
4.0000 mg | Freq: Four times a day (QID) | INTRAMUSCULAR | Status: DC | PRN
  Administered 2017-12-23: 4 mg via INTRAVENOUS
  Filled 2017-12-23: qty 2

## 2017-12-23 SURGICAL SUPPLY — 64 items
BAG RETRIEVAL 10 (BASKET)
BAG RETRIEVAL 10MM (BASKET)
BLADE CLIPPER SURG (BLADE) IMPLANT
CANISTER SUCT 3000ML PPV (MISCELLANEOUS) ×6 IMPLANT
CATH ROBINSON RED A/P 16FR (CATHETERS) ×3 IMPLANT
CLOSURE WOUND 1/4X4 (GAUZE/BANDAGES/DRESSINGS)
COVER BACK TABLE 60X90IN (DRAPES) ×3 IMPLANT
COVER MAYO STAND STRL (DRAPES) ×6 IMPLANT
COVER SURGICAL LIGHT HANDLE (MISCELLANEOUS) ×6 IMPLANT
DERMABOND ADVANCED (GAUZE/BANDAGES/DRESSINGS) ×4
DERMABOND ADVANCED .7 DNX12 (GAUZE/BANDAGES/DRESSINGS) ×2 IMPLANT
DRSG COVADERM PLUS 2X2 (GAUZE/BANDAGES/DRESSINGS) IMPLANT
DRSG OPSITE POSTOP 3X4 (GAUZE/BANDAGES/DRESSINGS) ×3 IMPLANT
DURAPREP 26ML APPLICATOR (WOUND CARE) ×3 IMPLANT
ELECT REM PT RETURN 9FT ADLT (ELECTROSURGICAL) ×3
ELECTRODE REM PT RTRN 9FT ADLT (ELECTROSURGICAL) ×1 IMPLANT
GLOVE BIO SURGEON STRL SZ 6.5 (GLOVE) ×2 IMPLANT
GLOVE BIO SURGEON STRL SZ7 (GLOVE) ×12 IMPLANT
GLOVE BIO SURGEONS STRL SZ 6.5 (GLOVE) ×1
GLOVE ECLIPSE 6.5 STRL STRAW (GLOVE) ×3 IMPLANT
GLOVE ECLIPSE 7.5 STRL STRAW (GLOVE) ×3 IMPLANT
HOLDER FOLEY CATH W/STRAP (MISCELLANEOUS) ×3 IMPLANT
KIT RM TURNOVER CYSTO AR (KITS) ×3 IMPLANT
LEGGING LITHOTOMY PAIR STRL (DRAPES) ×3 IMPLANT
NEEDLE HYPO 22GX1.5 SAFETY (NEEDLE) ×3 IMPLANT
NEEDLE INSUFFLATION 14GA 120MM (NEEDLE) ×3 IMPLANT
NS IRRIG 500ML POUR BTL (IV SOLUTION) ×3 IMPLANT
PACK LAVH (CUSTOM PROCEDURE TRAY) ×3 IMPLANT
PACK ROBOTIC GOWN (GOWN DISPOSABLE) IMPLANT
PACK TRENDGUARD 450 HYBRID PRO (MISCELLANEOUS) ×1 IMPLANT
PAD OB MATERNITY 4.3X12.25 (PERSONAL CARE ITEMS) ×3 IMPLANT
PAD PREP 24X48 CUFFED NSTRL (MISCELLANEOUS) ×3 IMPLANT
SCISSORS LAP 5X45 EPIX DISP (ENDOMECHANICALS) IMPLANT
SEALER TISSUE G2 CVD JAW 45CM (ENDOMECHANICALS) ×3 IMPLANT
SET IRRIG TUBING LAPAROSCOPIC (IRRIGATION / IRRIGATOR) ×3 IMPLANT
SET IRRIG Y TYPE TUR BLADDER L (SET/KITS/TRAYS/PACK) IMPLANT
SOLUTION ELECTROLUBE (MISCELLANEOUS) IMPLANT
STRIP CLOSURE SKIN 1/4X4 (GAUZE/BANDAGES/DRESSINGS) IMPLANT
SUT VIC AB 0 CT1 18XCR BRD8 (SUTURE) ×2 IMPLANT
SUT VIC AB 0 CT1 36 (SUTURE) ×6 IMPLANT
SUT VIC AB 0 CT1 8-18 (SUTURE) ×4
SUT VIC AB 2-0 CT1 (SUTURE) IMPLANT
SUT VIC AB 2-0 SH 27 (SUTURE)
SUT VIC AB 2-0 SH 27XBRD (SUTURE) IMPLANT
SUT VIC AB 3-0 SH 27 (SUTURE)
SUT VIC AB 3-0 SH 27X BRD (SUTURE) IMPLANT
SUT VICRYL 0 UR6 27IN ABS (SUTURE) IMPLANT
SUT VICRYL 1 TIES 12X18 (SUTURE) ×3 IMPLANT
SUT VICRYL 4-0 PS2 18IN ABS (SUTURE) ×3 IMPLANT
SYR BULB IRRIGATION 50ML (SYRINGE) IMPLANT
SYS BAG RETRIEVAL 10MM (BASKET)
SYSTEM BAG RETRIEVAL 10MM (BASKET) IMPLANT
TOWEL OR 17X24 6PK STRL BLUE (TOWEL DISPOSABLE) ×6 IMPLANT
TRAY FOLEY CATH SILVER 14FR (SET/KITS/TRAYS/PACK) ×3 IMPLANT
TRENDGUARD 450 HYBRID PRO PACK (MISCELLANEOUS) ×3
TROCAR BALLN 12MMX100 BLUNT (TROCAR) IMPLANT
TROCAR BLADELESS OPT 12M 100M (ENDOMECHANICALS) IMPLANT
TROCAR OPTI TIP 5M 100M (ENDOMECHANICALS) ×3 IMPLANT
TROCAR XCEL DIL TIP R 11M (ENDOMECHANICALS) ×3 IMPLANT
TUBE CONNECTING 12'X1/4 (SUCTIONS) ×1
TUBE CONNECTING 12X1/4 (SUCTIONS) ×2 IMPLANT
TUBING INSUF HEATED (TUBING) ×3 IMPLANT
WARMER LAPAROSCOPE (MISCELLANEOUS) ×3 IMPLANT
WATER STERILE IRR 500ML POUR (IV SOLUTION) ×3 IMPLANT

## 2017-12-23 NOTE — Anesthesia Postprocedure Evaluation (Signed)
Anesthesia Post Note  Patient: Kim Bauer  Procedure(s) Performed: LAPAROSCOPIC ASSISTED VAGINAL HYSTERECTOMY WITH SALPINGECTOMY (Bilateral )     Patient location during evaluation: PACU Anesthesia Type: General Level of consciousness: sedated, patient cooperative and oriented Pain management: pain level controlled (pain improving) Vital Signs Assessment: post-procedure vital signs reviewed and stable Respiratory status: spontaneous breathing, nonlabored ventilation and respiratory function stable Cardiovascular status: blood pressure returned to baseline and stable Postop Assessment: no apparent nausea or vomiting Anesthetic complications: no    Last Vitals:  Vitals:   12/23/17 1015 12/23/17 1030  BP: (!) 142/84 137/86  Pulse: 75 76  Resp: 15 13  Temp:    SpO2: 100% 100%    Last Pain:  Vitals:   12/23/17 1030  TempSrc:   PainSc: 6                  Chrystal Zeimet,E. Milaina Sher

## 2017-12-23 NOTE — H&P (Signed)
  History and physical exam unchanged 

## 2017-12-23 NOTE — Brief Op Note (Signed)
Patient name Kim Bauer, Jacot DICTATION# [id number] CSN# [id number]  [facility], MD 12/23/2017 9:09 AM

## 2017-12-23 NOTE — Anesthesia Procedure Notes (Signed)
Procedure Name: Intubation Date/Time: 12/23/2017 7:31 AM Performed by: Bonney Aid, CRNA Pre-anesthesia Checklist: Patient identified, Emergency Drugs available, Suction available and Patient being monitored Patient Re-evaluated:Patient Re-evaluated prior to induction Oxygen Delivery Method: Circle system utilized Preoxygenation: Pre-oxygenation with 100% oxygen Induction Type: IV induction Ventilation: Mask ventilation without difficulty Laryngoscope Size: Mac and 3 Tube type: Oral Tube size: 7.0 mm Number of attempts: 1 Airway Equipment and Method: Stylet and Oral airway Placement Confirmation: ETT inserted through vocal cords under direct vision,  positive ETCO2 and breath sounds checked- equal and bilateral Secured at: 21 cm Tube secured with: Tape Dental Injury: Teeth and Oropharynx as per pre-operative assessment

## 2017-12-23 NOTE — Progress Notes (Signed)
Patient ID: Kim Bauer, female   DOB: 09-30-1986, 32 y.o.   MRN: [id number] AV VSS ABD SOFT DRESSING DRY  GOOD UO

## 2017-12-23 NOTE — Transfer of Care (Signed)
Immediate Anesthesia Transfer of Care Note  Patient: Kim Bauer  Procedure(s) Performed: LAPAROSCOPIC ASSISTED VAGINAL HYSTERECTOMY WITH SALPINGECTOMY (Bilateral )  Patient Location: PACU  Anesthesia Type:General  Level of Consciousness: drowsy and responds to stimulation  Airway & Oxygen Therapy: Patient Spontanous Breathing and Patient connected to nasal cannula oxygen  Post-op Assessment: Report given to RN  Post vital signs: Reviewed and stable  Last Vitals: 133/90, 72, 15, 100%, 97.5Ax Vitals:   12/23/17 0549 12/23/17 0919  BP: 128/80   Pulse: 84   Resp: 16   Temp: 36.5 C (!) 36.4 C  SpO2: 100%     Last Pain:  Vitals:   12/23/17 0549  TempSrc: Oral      Patients Stated Pain Goal: 4 (78/29/56 2130)  Complications: No apparent anesthesia complications

## 2017-12-23 NOTE — Anesthesia Preprocedure Evaluation (Addendum)
Anesthesia Evaluation  Patient identified by MRN, date of birth, ID band Patient awake    Reviewed: Allergy & Precautions, NPO status , Patient's Chart, lab work & pertinent test results  History of Anesthesia Complications Negative for: history of anesthetic complications  Airway Mallampati: I  TM Distance: >3 FB Neck ROM: Full    Dental  (+) Chipped, Dental Advisory Given   Pulmonary Current Smoker,    breath sounds clear to auscultation       Cardiovascular negative cardio ROS   Rhythm:Regular Rate:Normal     Neuro/Psych negative neurological ROS     GI/Hepatic negative GI ROS, Neg liver ROS,   Endo/Other  negative endocrine ROS  Renal/GU negative Renal ROS     Musculoskeletal   Abdominal   Peds  Hematology negative hematology ROS (+)   Anesthesia Other Findings   Reproductive/Obstetrics                            Anesthesia Physical Anesthesia Plan  ASA: II  Anesthesia Plan: General   Post-op Pain Management:    Induction: Intravenous  PONV Risk Score and Plan: 3 and Ondansetron, Dexamethasone and Scopolamine patch - Pre-op  Airway Management Planned: Oral ETT  Additional Equipment:   Intra-op Plan:   Post-operative Plan: Extubation in OR  Informed Consent: I have reviewed the patients History and Physical, chart, labs and discussed the procedure including the risks, benefits and alternatives for the proposed anesthesia with the patient or authorized representative who has indicated his/her understanding and acceptance.   Dental advisory given  Plan Discussed with: CRNA and Surgeon  Anesthesia Plan Comments: (Plan routine monitors, GETA)        Anesthesia Quick Evaluation

## 2017-12-24 ENCOUNTER — Encounter (HOSPITAL_BASED_OUTPATIENT_CLINIC_OR_DEPARTMENT_OTHER): Payer: Self-pay | Admitting: Obstetrics and Gynecology

## 2017-12-24 DIAGNOSIS — N871 Moderate cervical dysplasia: Secondary | ICD-10-CM | POA: Diagnosis not present

## 2017-12-24 LAB — CBC
HCT: 35.3 % — ABNORMAL LOW (ref 36.0–46.0)
Hemoglobin: 11.8 g/dL — ABNORMAL LOW (ref 12.0–15.0)
MCH: 28.4 pg (ref 26.0–34.0)
MCHC: 33.4 g/dL (ref 30.0–36.0)
MCV: 85.1 fL (ref 78.0–100.0)
PLATELETS: 207 10*3/uL (ref 150–400)
RBC: 4.15 MIL/uL (ref 3.87–5.11)
RDW: 14.1 % (ref 11.5–15.5)
WBC: 9.2 10*3/uL (ref 4.0–10.5)

## 2017-12-24 MED ORDER — OXYCODONE-ACETAMINOPHEN 7.5-325 MG PO TABS: 1.0000 | ORAL_TABLET | ORAL | 0 refills | Status: DC | PRN

## 2017-12-24 MED ORDER — OXYCODONE-ACETAMINOPHEN 5-325 MG PO TABS
ORAL_TABLET | ORAL | Status: AC
  Filled 2017-12-24: qty 1

## 2017-12-24 NOTE — Discharge Summary (Signed)
Patient name Kim Bauer, Gr[place] DICTATION# [id number] CSN# [id number]  [facility], MD 12/24/2017 8:56 AM

## 2017-12-24 NOTE — Progress Notes (Signed)
Pt slept most of night.   Pt oob in hall w/ standby asst and ambulated approx 200 ft.  Tolerated well.  Eating breakfast with no c/o nausea or pain.  Minimal bldy drng on honeycomb dsg and on peripad.

## 2017-12-24 NOTE — Discharge Summary (Signed)
NAMESALLI, BODIN NO.:  [id number]  MEDICAL RECORD NO.:  [id number]  LOCATION:                                 FACILITY:  PHYSICIAN:  Darlyn Chamber, M.D.        DATE OF BIRTH:  DATE OF ADMISSION:  12/23/2017 DATE OF DISCHARGE:  12/24/2017                              DISCHARGE SUMMARY   ADMITTING DIAGNOSIS:  Persistent abnormal cervical cytology.  POSTOPERATIVE DIAGNOSIS:  Persistent abnormal cervical cytology.  OPERATIVE PROCEDURES:  Laparoscopic-assisted vaginal hysterectomy with removal of both fallopian tubes.  For complete history and physical, please see dictated note.  COURSE IN THE HOSPITAL:  The patient underwent above-noted surgery. Postop, she did well.  Postop hemoglobin is 11.8.  She was afebrile with stable vital signs.  Abdomen was soft.  Bowel sounds were active.  She was tolerating her diet.  Urine output was clear and adequate. Incisions were all intact.  She was having minimal spotting.  In terms of complications, none were countered during her stay in the hospital.  The patient is discharged home in stable condition.  DISPOSITION:  The patient is to avoid vaginal entrance, heavy lifting. She should be limited in terms of driving.  She should call should there be heavy vaginal bleeding, nausea, vomiting, fever, or excessive pain. Also instructed signs and symptoms of deep venous thrombosis and pulmonary embolus.  Discharged home on Percocet.  She needs it for pain.     Darlyn Chamber, M.D.     JSM/MEDQ  D:  12/24/2017  T:  12/24/2017  Job:  [id number]

## 2017-12-24 NOTE — Progress Notes (Signed)
1 Day Post-Op Procedure(s) (LRB): LAPAROSCOPIC ASSISTED VAGINAL HYSTERECTOMY WITH SALPINGECTOMY (Bilateral)  Subjective: Patient reports tolerating PO.    Objective: I have reviewed patient's vital signs, intake and output and labs.  General: alert GI: soft, non-tender; bowel sounds normal; no masses,  no organomegaly Vaginal Bleeding: none  Assessment: s/p Procedure(s) with comments: LAPAROSCOPIC ASSISTED VAGINAL HYSTERECTOMY WITH SALPINGECTOMY (Bilateral) - need bed: stable  Plan: Discharge home  LOS: 0 days    Kim Bauer S 12/24/2017, 8:53 AM

## 2017-12-24 NOTE — Op Note (Signed)
NAMEPRANATHI, WINFREE NO.:  [id number]  MEDICAL RECORD NO.:  [id number]  LOCATION:                                 FACILITY:  PHYSICIAN:  Darlyn Chamber, M.D.        DATE OF BIRTH:  DATE OF PROCEDURE:  12/23/2017 DATE OF DISCHARGE:                              OPERATIVE REPORT   PREOPERATIVE DIAGNOSIS:  Persistent abnormal cervical cytology.  POSTOPERATIVE DIAGNOSIS:  Persistent abnormal cervical cytology.  OPERATIVE PROCEDURE:  Laparoscopic-assisted vaginal hysterectomy with removal of both fallopian tubes.  SURGEON:  Darlyn Chamber, M.D.  ASSISTANT:  Lucillie Garfinkel.  ANESTHESIA:  General endotracheal.  ESTIMATED BLOOD LOSS:  400 cc.  PACKS:  None.  DRAINS:  Included urethral Foley.  INTRAOPERATIVE BLOOD REPLACED:  None.  COMPLICATIONS:  None.  INDICATIONS:  Dictated in history and physical.  PROCEDURE IN DETAIL:  The patient was taken to the OR and placed in supine position.  After satisfactory level of general endotracheal anesthesia was obtained, the patient was placed in the dorsal lithotomy position using the [employer].  Perineum and vagina were prepped out with Betadine.  A Hulka tenaculum was put in place.  Bladder was emptied by in-and-out catheterization.  The abdomen was prepped with DuraPrep. The patient was draped in a sterile field.  Subumbilical incision was made with knife.  Veress needle was introduced into the abdominal cavity.  Abdomen was insufflated with approximated 2 L of carbon dioxide.  A 10/11 trocar was then introduced.  There was no evidence of injury to adjacent organs.  A 5-mm trocar was put in place under direct visualization.  Uterus was normal size and shape.  Both ovaries were normal.  Tubes were normal.  We could see a previous area where she had a tubal ligation.  We brought in the Enseal, went first to the right side.  The mesentry of the right tube was cauterized and incised.  The tube was removed  through the subumbilical port.  Next, the uteroovarian pedicle on the right side was cauterized and incised using Enseal.  The right round ligament was cauterized and incised.  We had good hemostasis.  We then went to the left side.  Left tube was elevated. The mesenteric attachment was cauterized and incised using Enseal, and the tube was removed through the subumbilical port.  Next, the uteroovarian pedicle on the left side was cauterized and incised using Enseal.  The left round ligament was cauterized and incised.  We had good hemostasis.  We decided to proceed with vaginal approach.  The abdomen was desufflated with carbon dioxide.  The laparoscope had been removed.  The patient's legs were repositioned.  The Hulka tenaculum was then removed.  A weighted speculum was placed in the vaginal vault.  Cervix was grasped with a Ardis Hughs tenaculum.  Cul-de-sac was entered sharply.  Both uterosacral ligaments were clamped, cut, and suture ligated with 0-Vicryl.  The reflection of vaginal mucosa anteriorly was incised.  The bladder was dissected superiorly. Paracervical tissue was then clamped, cut, and suture ligated with 0 Vicryl.  Vesicouterine space was identified, entered sharply, and a retractor was put in place.  Using the clamp,  cut, and tie technique with suture ligature of 0-Vicryl, parametrium was serially separated from the sides of uterus.  The uterus was then flipped.  All pedicles were clamped and cut.  The pedicle on the left side did get loose.  On the right side, we ligated that with a free tie of 0-Vicryl.  We could see an arterial bleeder on the left side.  It was clamped and doubly ligated first with a free tie of 0-Vicryl and then a suture ligature of 0-Vicryl.  Next, the posterior vaginal cuff was run with a running locking suture of 0-Vicryl.  Uterosacral plication stitch of 0-Vicryl was put in place and secured.  Vaginal mucosa was reapproximated with interrupted  figure-of-eight of 0-Vicryl.  Foley was placed straight drain, clear urine was noted.  At this point in time, the patient's legs were repositioned, the abdomen was re-insufflated with carbon dioxide.  Laparoscope was reintroduced. Using the Enseal, areas of oozing were brought under control.  Some areas of the vaginal cuff were cauterized too.  At this point in time, we had good hemostasis.  We de-insufflated the abdomen.  No bleeding was encountered.  The abdomen was desufflated with carbon dioxide.  All trocars were removed.  Subumbilical incision was closed with interrupted subcuticulars of 4-0 Vicryl and Dermabond.  Suprapubic incision was closed with Dermabond.  Urine output from the Foley was clear.  Sponge, instrument, and needle counts were reported as correct by circulating nurse.  The patient tolerated the procedure well, was returned to Recovery in good condition.     Darlyn Chamber, M.D.     JSM/MEDQ  D:  12/23/2017  T:  12/24/2017  Job:  [id number]

## 2018-06-07 ENCOUNTER — Emergency Department (HOSPITAL_COMMUNITY)
Admission: EM | Admit: 2018-06-07 | Discharge: 2018-06-07 | Disposition: A | Discharge status: Home / Self Care | Payer: BLUE CROSS/BLUE SHIELD | Attending: Emergency Medicine | Admitting: Emergency Medicine

## 2018-06-07 ENCOUNTER — Encounter: Payer: Self-pay | Admitting: Emergency Medicine

## 2018-06-07 DIAGNOSIS — F17200 Nicotine dependence, unspecified, uncomplicated: Secondary | ICD-10-CM | POA: Diagnosis not present

## 2018-06-07 DIAGNOSIS — F419 Anxiety disorder, unspecified: Secondary | ICD-10-CM | POA: Diagnosis not present

## 2018-06-07 DIAGNOSIS — F410 Panic disorder [episodic paroxysmal anxiety] without agoraphobia: Secondary | ICD-10-CM

## 2018-06-07 MED ORDER — IBUPROFEN 200 MG PO TABS
600.0000 mg | ORAL_TABLET | Freq: Once | ORAL | Status: AC
  Administered 2018-06-07: 600 mg via ORAL
  Filled 2018-06-07: qty 3
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
## 2018-06-07 NOTE — Discharge Instructions (Signed)
Please follow-up with family doctor if continue to have symptoms.  Return if worsening.

## 2018-06-07 NOTE — ED Notes (Signed)
Went over discharge instructions and follow up with Pt. Pt verbalized understanding and ambulated out of facility.

## 2018-06-07 NOTE — ED Triage Notes (Signed)
Pt arrived by EMS from home. Pt reports that she has been under a lot of stress for the past week. Pt reports that she has been crying non-stop. Pt denies any SI or HI and does not have any previous history of anxiety.

## 2018-06-07 NOTE — ED Notes (Signed)
Bed: [facility] Expected date:  Expected time:  Means of arrival:  Comments: 63 F Anxiety

## 2018-06-07 NOTE — ED Notes (Signed)
PA at bedside.

## 2018-12-16 ENCOUNTER — Encounter (HOSPITAL_COMMUNITY): Payer: Self-pay

## 2018-12-16 ENCOUNTER — Ambulatory Visit (HOSPITAL_COMMUNITY)
Admission: EM | Admit: 2018-12-16 | Discharge: 2018-12-16 | Disposition: A | Discharge status: Home / Self Care | Payer: BLUE CROSS/BLUE SHIELD | Attending: Family Medicine | Admitting: Family Medicine

## 2018-12-16 DIAGNOSIS — R69 Illness, unspecified: Secondary | ICD-10-CM | POA: Diagnosis not present

## 2018-12-16 DIAGNOSIS — J111 Influenza due to unidentified influenza virus with other respiratory manifestations: Secondary | ICD-10-CM

## 2018-12-16 MED ORDER — HYDROCODONE-HOMATROPINE 5-1.5 MG/5ML PO SYRP: 5.0000 mL | ORAL_SOLUTION | Freq: Four times a day (QID) | ORAL | 0 refills | Status: DC | PRN

## 2018-12-16 MED ORDER — OSELTAMIVIR PHOSPHATE 75 MG PO CAPS: 75.0000 mg | ORAL_CAPSULE | Freq: Two times a day (BID) | ORAL | 0 refills | Status: AC

## 2018-12-16 MED ORDER — ACETAMINOPHEN 325 MG PO TABS
ORAL_TABLET | ORAL | Status: AC
  Filled 2018-12-16: qty 2

## 2018-12-16 MED ORDER — ACETAMINOPHEN 325 MG PO TABS
650.0000 mg | ORAL_TABLET | Freq: Once | ORAL | Status: AC
  Administered 2018-12-16: 650 mg via ORAL

## 2018-12-16 NOTE — Discharge Instructions (Signed)

## 2018-12-16 NOTE — ED Triage Notes (Signed)
Pt presents with flu like symptoms; chills, generalized body aches, fever, and nausea.
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
## 2020-04-25 DIAGNOSIS — F4321 Adjustment disorder with depressed mood: Secondary | ICD-10-CM | POA: Insufficient documentation

## 2020-04-26 ENCOUNTER — Other Ambulatory Visit: Payer: Self-pay

## 2020-04-26 ENCOUNTER — Encounter (HOSPITAL_COMMUNITY): Payer: Self-pay

## 2020-04-26 ENCOUNTER — Ambulatory Visit (HOSPITAL_COMMUNITY)
Admission: EM | Admit: 2020-04-26 | Discharge: 2020-04-26 | Disposition: A | Discharge status: Home / Self Care | Payer: BC Managed Care – PPO | Attending: Family Medicine | Admitting: Family Medicine

## 2020-04-26 DIAGNOSIS — S6991XA Unspecified injury of right wrist, hand and finger(s), initial encounter: Secondary | ICD-10-CM | POA: Diagnosis not present

## 2020-04-26 MED ORDER — TETANUS-DIPHTH-ACELL PERTUSSIS 5-2.5-18.5 LF-MCG/0.5 IM SUSP
0.5000 mL | Freq: Once | INTRAMUSCULAR | Status: AC
  Administered 2020-04-26: 0.5 mL via INTRAMUSCULAR

## 2020-04-26 MED ORDER — LIDOCAINE HCL (PF) 2 % IJ SOLN
INTRAMUSCULAR | Status: AC
  Filled 2020-04-26: qty 5

## 2020-04-26 MED ORDER — TETANUS-DIPHTH-ACELL PERTUSSIS 5-2.5-18.5 LF-MCG/0.5 IM SUSP
INTRAMUSCULAR | Status: AC
  Filled 2020-04-26: qty 0.5

## 2020-04-26 MED ORDER — IBUPROFEN 600 MG PO TABS: 600.0000 mg | ORAL_TABLET | Freq: Four times a day (QID) | ORAL | 0 refills | Status: DC | PRN

## 2020-04-26 NOTE — Discharge Instructions (Signed)
Use anti-inflammatories for pain/swelling. You may take up to 800 mg Ibuprofen every 8 hours with food. You may supplement Ibuprofen with Tylenol [phone] mg every 8 hours.   Keep clean and dry  Follow up if developing any signs of infection or limitations in movement of finger

## 2020-04-26 NOTE — ED Triage Notes (Addendum)
Patient got fishing hook stuck in her right middle finger. Unsure of last TDap. Willing to get one today.
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
## 2020-05-26 ENCOUNTER — Other Ambulatory Visit: Payer: Self-pay

## 2020-05-26 ENCOUNTER — Emergency Department (HOSPITAL_COMMUNITY)
Admission: EM | Admit: 2020-05-26 | Discharge: 2020-05-26 | Disposition: A | Discharge status: Home / Self Care | Payer: BC Managed Care – PPO | Attending: Emergency Medicine | Admitting: Emergency Medicine

## 2020-05-26 ENCOUNTER — Encounter (HOSPITAL_COMMUNITY): Payer: Self-pay | Admitting: Emergency Medicine

## 2020-05-26 DIAGNOSIS — F17290 Nicotine dependence, other tobacco product, uncomplicated: Secondary | ICD-10-CM | POA: Insufficient documentation

## 2020-05-26 DIAGNOSIS — Z79899 Other long term (current) drug therapy: Secondary | ICD-10-CM | POA: Insufficient documentation

## 2020-05-26 DIAGNOSIS — H6123 Impacted cerumen, bilateral: Secondary | ICD-10-CM | POA: Diagnosis not present

## 2020-05-26 DIAGNOSIS — H9202 Otalgia, left ear: Secondary | ICD-10-CM | POA: Diagnosis present

## 2020-05-26 MED ORDER — CARBAMIDE PEROXIDE 6.5 % OT SOLN
5.0000 [drp] | Freq: Once | OTIC | Status: AC
  Administered 2020-05-26: 5 [drp] via OTIC
  Filled 2020-05-26: qty 15
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
## 2020-05-26 NOTE — Discharge Instructions (Signed)
Continue to use the drops at home.  Follow-up with the ear nose and throat providers for definitive removal of the earwax given we are unable to remove all this here in the emergency department.

## 2020-05-26 NOTE — ED Triage Notes (Signed)
34 yo female presents POV c/o left ear pain. Pt states while trying to clean her ears she believes she pushed the wax deeper into her ear canal, which is now causing pain. Pt states she has tried wax removal kits, peroxide, and nothing seems to be helping.

## 2020-05-30 DIAGNOSIS — H6123 Impacted cerumen, bilateral: Secondary | ICD-10-CM

## 2020-05-30 DIAGNOSIS — S00412A Abrasion of left ear, initial encounter: Secondary | ICD-10-CM | POA: Insufficient documentation

## 2020-05-30 DIAGNOSIS — H9202 Otalgia, left ear: Secondary | ICD-10-CM | POA: Insufficient documentation

## 2020-05-30 HISTORY — DX: Abrasion of left ear, initial encounter: S00.412A

## 2020-05-30 HISTORY — DX: Impacted cerumen, bilateral: H61.23

## 2020-08-21 ENCOUNTER — Emergency Department (HOSPITAL_COMMUNITY): Payer: BC Managed Care – PPO

## 2020-08-21 ENCOUNTER — Emergency Department (HOSPITAL_COMMUNITY)
Admission: EM | Admit: 2020-08-21 | Discharge: 2020-08-21 | Disposition: A | Discharge status: Home / Self Care | Payer: BC Managed Care – PPO | Attending: Emergency Medicine | Admitting: Emergency Medicine

## 2020-08-21 ENCOUNTER — Encounter (HOSPITAL_COMMUNITY): Payer: Self-pay | Admitting: Obstetrics and Gynecology

## 2020-08-21 ENCOUNTER — Other Ambulatory Visit: Payer: Self-pay

## 2020-08-21 DIAGNOSIS — R0789 Other chest pain: Secondary | ICD-10-CM | POA: Insufficient documentation

## 2020-08-21 DIAGNOSIS — F17290 Nicotine dependence, other tobacco product, uncomplicated: Secondary | ICD-10-CM | POA: Diagnosis not present

## 2020-08-21 DIAGNOSIS — R0602 Shortness of breath: Secondary | ICD-10-CM | POA: Diagnosis not present

## 2020-08-21 LAB — BASIC METABOLIC PANEL
Anion gap: 5 (ref 5–15)
BUN: 11 mg/dL (ref 6–20)
CO2: 26 mmol/L (ref 22–32)
Calcium: 8.3 mg/dL — ABNORMAL LOW (ref 8.9–10.3)
Chloride: 105 mmol/L (ref 98–111)
Creatinine, Ser: 0.68 mg/dL (ref 0.44–1.00)
GFR calc Af Amer: >60 mL/min (ref >60)
GFR calc non Af Amer: >60 mL/min (ref >60)
Glucose, Bld: 94 mg/dL (ref 70–99)
Potassium: 3.8 mmol/L (ref 3.5–5.1)
Sodium: 136 mmol/L (ref 135–145)

## 2020-08-21 LAB — CBC
HCT: 39.9 % (ref 36.0–46.0)
Hemoglobin: 12.7 g/dL (ref 12.0–15.0)
MCH: 28.7 pg (ref 26.0–34.0)
MCHC: 31.8 g/dL (ref 30.0–36.0)
MCV: 90.1 fL (ref 80.0–100.0)
Platelets: 224 10*3/uL (ref 150–400)
RBC: 4.43 MIL/uL (ref 3.87–5.11)
RDW: 13.7 % (ref 11.5–15.5)
WBC: 3.5 10*3/uL — ABNORMAL LOW (ref 4.0–10.5)
nRBC: 0 % (ref 0.0–0.2)

## 2020-08-21 LAB — TROPONIN I (HIGH SENSITIVITY): Troponin I (High Sensitivity): <2 ng/L (ref <18)

## 2020-08-21 NOTE — ED Triage Notes (Signed)
Patient reports to the ER for chest pain and Sob. Patient reports it started yesterday morning.

## 2020-08-21 NOTE — Discharge Instructions (Signed)
You were seen in the emergency department today for chest pain. Your work-up in the emergency department has been overall reassuring. Your labs have been fairly normal and or similar to previous blood work you have had done. Your EKG and the enzyme we use to check your heart did not show an acute heart attack at this time. Your chest x-ray was normal.   We would like you to follow up closely with your primary care provider. You can use motrin and tylenol for pain. Return to the ER immediately should you experience any new or worsening symptoms including but not limited to return of pain, worsened pain, vomiting, shortness of breath, dizziness, lightheadedness, passing out, or any other concerns that you may have.
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
## 2021-10-12 DIAGNOSIS — R609 Edema, unspecified: Secondary | ICD-10-CM | POA: Diagnosis not present

## 2021-10-12 DIAGNOSIS — M79671 Pain in right foot: Secondary | ICD-10-CM | POA: Diagnosis not present

## 2021-10-12 DIAGNOSIS — M79672 Pain in left foot: Secondary | ICD-10-CM | POA: Diagnosis not present

## 2022-03-20 ENCOUNTER — Other Ambulatory Visit: Payer: Self-pay

## 2022-03-20 ENCOUNTER — Emergency Department (HOSPITAL_COMMUNITY)
Admission: EM | Admit: 2022-03-20 | Discharge: 2022-03-20 | Disposition: A | Discharge status: Home / Self Care | Payer: BC Managed Care – PPO | Attending: Emergency Medicine | Admitting: Emergency Medicine

## 2022-03-20 ENCOUNTER — Emergency Department (HOSPITAL_COMMUNITY): Payer: BC Managed Care – PPO

## 2022-03-20 DIAGNOSIS — M25561 Pain in right knee: Secondary | ICD-10-CM | POA: Insufficient documentation

## 2022-03-20 MED ORDER — METHOCARBAMOL 500 MG PO TABS: 500.0000 mg | ORAL_TABLET | Freq: Two times a day (BID) | ORAL | 0 refills | Status: DC | PRN

## 2022-03-20 MED ORDER — HYDROCODONE-ACETAMINOPHEN 5-325 MG PO TABS
1.0000 | ORAL_TABLET | Freq: Once | ORAL | Status: AC
  Administered 2022-03-20: 1 via ORAL
  Filled 2022-03-20: qty 1

## 2022-03-20 NOTE — Discharge Instructions (Addendum)
I am prescribing you a strong muscle relaxer called Robaxin.  You can take this up to 2 times a day for management of your pain.  This medication can be sedating so do not mix it with alcohol.  Do not drive a car after taking it. ? ?If you develop any new or worsening symptoms please do not hesitate to return to the emergency department. It was a pleasure to meet you. ? ?

## 2022-03-20 NOTE — ED Provider Notes (Signed)
?[facility] DEPT ?Provider Note ? ? ?CSN: [id number] ?Arrival date & time: 03/20/22  0414 ? ?  ? ?History ? ?Chief Complaint  ?Patient presents with  ? Knee Pain  ? ? ?Kim Bauer is a 36 y.o. female. ? ?HPI ?Patient is a 36 year old female who presents to the emergency department due to atraumatic right knee pain.  States it started yesterday morning.  Has been constant.  Worsens when bearing weight or with range of motion of the joint.  Denies any swelling, fevers, chills, nausea, vomiting, numbness.  Denies a history of injury or surgery to the knee.  Denies any other regions of pain. ?  ? ?Home Medications ?Prior to Admission medications   ?Medication Sig Start Date End Date Taking? Authorizing Provider  ?methocarbamol (ROBAXIN) 500 MG tablet Take 1 tablet (500 mg total) by mouth 2 (two) times daily as needed for muscle spasms. 03/20/22  Yes Rayna Sexton, PA-C  ?HYDROcodone-homatropine (HYCODAN) 5-1.5 MG/5ML syrup Take 5 mLs by mouth every 6 (six) hours as needed for cough. ?Patient not taking: Reported on 08/21/2020 12/16/18   Vanessa Kick, MD  ?ibuprofen (ADVIL) 600 MG tablet Take 1 tablet (600 mg total) by mouth every 6 (six) hours as needed. ?Patient not taking: Reported on 08/21/2020 04/26/20   Janith Lima, PA-C  ?   ? ?Allergies    ?Patient has no known allergies.   ? ?Review of Systems   ?Review of Systems  ?Constitutional:  Negative for chills and fever.  ?Gastrointestinal:  Negative for nausea and vomiting.  ?Musculoskeletal:  Positive for arthralgias. Negative for joint swelling.  ?Skin:  Negative for color change and wound.  ?Neurological:  Negative for weakness and numbness.  ? ?Physical Exam ?Updated Vital Signs ?BP 119/68 (BP Location: Left Arm)   Pulse 69   Temp 98.5 ?F (36.9 ?C) (Oral)   Resp 18   Ht '5\' 5"'$  (1.651 m)   Wt 95.3 kg   LMP 10/29/2016 (Approximate) Comment: neg preg test  SpO2 100%   BMI 34.95 kg/m?  ?Physical Exam ?Vitals and nursing note  reviewed.  ?Constitutional:   ?   General: She is not in acute distress. ?   Appearance: Normal appearance. She is not ill-appearing, toxic-appearing or diaphoretic.  ?HENT:  ?   Head: Normocephalic and atraumatic.  ?   Right Ear: External ear normal.  ?   Left Ear: External ear normal.  ?   Nose: Nose normal.  ?   Mouth/Throat:  ?   Mouth: Mucous membranes are moist.  ?   Pharynx: Oropharynx is clear. No oropharyngeal exudate or posterior oropharyngeal erythema.  ?Eyes:  ?   Extraocular Movements: Extraocular movements intact.  ?Cardiovascular:  ?   Rate and Rhythm: Normal rate and regular rhythm.  ?   Pulses: Normal pulses.  ?Pulmonary:  ?   Effort: Pulmonary effort is normal. No respiratory distress.  ?   Breath sounds: No stridor.  ?Abdominal:  ?   General: Abdomen is flat. There is no distension.  ?Musculoskeletal:     ?   General: Tenderness present. Normal range of motion.  ?   Cervical back: Normal range of motion and neck supple. No tenderness.  ?   Comments: Right knee: mild to moderate tenderness noted circumferentially in the joint.  Appears to be worst along the patellar region.  No increased warmth noted in the joint.  No soft tissue swelling.  No overlying rashes or lesions.  Unable to assess range  of motion due to patient's pain.  Distal sensation intact in the right leg.  2+ DP pulses.  ?Skin: ?   General: Skin is warm and dry.  ?Neurological:  ?   General: No focal deficit present.  ?   Mental Status: She is alert and oriented to person, place, and time.  ?Psychiatric:     ?   Mood and Affect: Mood normal.     ?   Behavior: Behavior normal.  ? ?ED Results / Procedures / Treatments   ?Labs ?(all labs ordered are listed, but only abnormal results are displayed) ?Labs Reviewed - No data to display ? ?EKG ?None ? ?Radiology ?DG Knee Complete 4 Views Right ? ?Result Date: 03/20/2022 ?CLINICAL DATA:  36 year old female with history of right knee pain for the past 24 hours. No history of injury. EXAM: RIGHT  KNEE - COMPLETE 4+ VIEW COMPARISON:  None. FINDINGS: No evidence of fracture, dislocation, or joint effusion. No evidence of arthropathy or other focal bone abnormality. Soft tissues are unremarkable. IMPRESSION: Negative. Electronically Signed   By: Vinnie Langton M.D.   On: 03/20/2022 05:47   ? ?Procedures ?Procedures  ? ?Medications Ordered in ED ?Medications  ?HYDROcodone-acetaminophen (NORCO/VICODIN) 5-325 MG per tablet 1 tablet (1 tablet Oral Given 03/20/22 0510)  ? ? ?ED Course/ Medical Decision Making/ A&P ?  ?                        ?Medical Decision Making ?Amount and/or Complexity of Data Reviewed ?Radiology: ordered. ? ?Risk ?Prescription drug management. ? ?Pt is a 36 y.o. female who presents to the emergency department due to atraumatic right knee pain that began yesterday. ? ?Imaging: ?X-ray of the right knee is negative. ? ?I, Rayna Sexton, PA-C, personally reviewed and evaluated these images and lab results as part of my medical decision-making. ? ?On my exam patient has mild to moderate tenderness circumferentially in the right knee.  Appears to be worst along the patellar region.  No increased warmth of the joint.  No soft tissue swelling.  No calf tenderness or calf swelling.  No overlying rashes or lesions.  Difficult to assess range of motion due to patient's pain.  Patient afebrile, nontachycardic, and nontoxic-appearing.  Denies any systemic symptoms such as fevers, chills, nausea, or vomiting.  Exam and history does not appear consistent with septic joint or gout. ? ?I obtained x-rays of the right knee which are negative.  We will place patient in a knee sleeve for comfort.  Will discharge on a course of Robaxin for her symptoms.  We discussed safety regarding this medication. ? ?Patient appears stable for discharge at this time and she is agreeable.  We discussed return precautions.  Her questions were answered and she was amicable at the time of discharge. ? ?Note: Portions of this  report may have been transcribed using voice recognition software. Every effort was made to ensure accuracy; however, inadvertent computerized transcription errors may be present.  ? ?Final Clinical Impression(s) / ED Diagnoses ?Final diagnoses:  ?Acute pain of right knee  ? ?Rx / DC Orders ?ED Discharge Orders   ? ?      Ordered  ?  methocarbamol (ROBAXIN) 500 MG tablet  2 times daily PRN       ? 03/20/22 0554  ? ?  ?  ? ?  ? ? ?  ?Rayna Sexton, PA-C ?03/20/22 0558 ? ?  ?Shanon Rosser, MD ?03/20/22 [phone] ? ?

## 2022-03-20 NOTE — ED Triage Notes (Signed)
BIB EMS from home for Rt knee pain no known injury ?

## 2022-04-25 DIAGNOSIS — N898 Other specified noninflammatory disorders of vagina: Secondary | ICD-10-CM | POA: Diagnosis not present

## 2022-04-25 DIAGNOSIS — B3731 Acute candidiasis of vulva and vagina: Secondary | ICD-10-CM | POA: Diagnosis not present

## 2022-04-25 DIAGNOSIS — R87811 Vaginal high risk human papillomavirus (HPV) DNA test positive: Secondary | ICD-10-CM | POA: Diagnosis not present

## 2022-04-25 DIAGNOSIS — Z01419 Encounter for gynecological examination (general) (routine) without abnormal findings: Secondary | ICD-10-CM | POA: Diagnosis not present

## 2022-04-25 DIAGNOSIS — N760 Acute vaginitis: Secondary | ICD-10-CM | POA: Diagnosis not present

## 2022-04-25 DIAGNOSIS — Z6833 Body mass index (BMI) 33.0-33.9, adult: Secondary | ICD-10-CM | POA: Diagnosis not present

## 2022-05-08 ENCOUNTER — Ambulatory Visit (INDEPENDENT_AMBULATORY_CARE_PROVIDER_SITE_OTHER): Payer: BC Managed Care – PPO | Admitting: Podiatry

## 2022-05-08 ENCOUNTER — Ambulatory Visit (INDEPENDENT_AMBULATORY_CARE_PROVIDER_SITE_OTHER): Payer: BC Managed Care – PPO

## 2022-05-08 DIAGNOSIS — M722 Plantar fascial fibromatosis: Secondary | ICD-10-CM

## 2022-05-08 MED ORDER — MELOXICAM 15 MG PO TABS: 15.0000 mg | ORAL_TABLET | Freq: Every day | ORAL | 3 refills | Status: DC

## 2022-05-09 NOTE — Progress Notes (Signed)
  Subjective:  Patient ID: Kim Bauer, female    DOB: October 20, 1986,  MRN: [id number]  Chief Complaint  Patient presents with   Foot Pain    left foot pain for a while on the bottom heel area    36 y.o. female presents with the above complaint. History confirmed with patient.  Most the pain is on the bottom of the foot.  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  Started about year ago.  She has used inserts which did not help.  Objective:  Physical Exam: warm, good capillary refill, no trophic changes or ulcerative lesions, normal DP and PT pulses, and normal sensory exam. Left Foot: point tenderness over the heel pad  Radiographs: Multiple views x-ray of the left foot: no fracture, dislocation, swelling or degenerative changes noted and plantar calcaneal spur Assessment:   1. Plantar fasciitis      Plan:  Patient was evaluated and treated and all questions answered.  Discussed the etiology and treatment options for plantar fasciitis including stretching, formal physical therapy, supportive shoegears such as a running shoe or sneaker, pre fabricated orthoses, injection therapy, and oral medications. We also discussed the role of surgical treatment of this for patients who do not improve after exhausting non-surgical treatment options.   -XR reviewed with patient -Educated patient on stretching and icing of the affected limb -Plantar fascial brace dispensed to offload the medial arch and plantar fascia to reduce inflammation -Injection delivered to the plantar fascia of the left foot. -Rx for meloxicam. Educated on use, risks and benefits of the medication  After sterile prep with povidone-iodine solution and alcohol, the left heel was injected with 0.5cc 2% xylocaine plain, 0.5cc 0.5% marcaine plain, '5mg'$  triamcinolone acetonide, and '2mg'$  dexamethasone was injected along the medial plantar fascia at the  insertion on the plantar calcaneus. The patient tolerated the procedure well without complication.  Return in about 1 month (around 06/08/2022) for recheck plantar fasciitis.

## 2022-06-07 ENCOUNTER — Ambulatory Visit (INDEPENDENT_AMBULATORY_CARE_PROVIDER_SITE_OTHER): Payer: Self-pay | Admitting: Podiatry

## 2022-06-07 DIAGNOSIS — Z91199 Patient's noncompliance with other medical treatment and regimen due to unspecified reason: Secondary | ICD-10-CM

## 2022-07-24 ENCOUNTER — Encounter (HOSPITAL_COMMUNITY): Payer: Self-pay | Admitting: Emergency Medicine

## 2022-07-24 ENCOUNTER — Other Ambulatory Visit: Payer: Self-pay

## 2022-07-24 ENCOUNTER — Emergency Department (HOSPITAL_COMMUNITY)
Admission: EM | Admit: 2022-07-24 | Discharge: 2022-07-24 | Disposition: A | Discharge status: Home / Self Care | Payer: BC Managed Care – PPO | Attending: Student | Admitting: Student

## 2022-07-24 DIAGNOSIS — M722 Plantar fascial fibromatosis: Secondary | ICD-10-CM

## 2022-07-24 DIAGNOSIS — Z720 Tobacco use: Secondary | ICD-10-CM

## 2022-07-24 DIAGNOSIS — M79672 Pain in left foot: Secondary | ICD-10-CM | POA: Diagnosis not present

## 2022-07-24 HISTORY — DX: Plantar fascial fibromatosis: M72.2

## 2022-07-24 MED ORDER — MELOXICAM 15 MG PO TABS: 15.0000 mg | ORAL_TABLET | Freq: Every day | ORAL | 0 refills | Status: DC

## 2022-07-24 NOTE — ED Provider Notes (Signed)
[facility] DEPT Provider Note   CSN: [id number] Arrival date & time: 07/24/22  3016     History  Chief Complaint  Patient presents with   Foot Pain    Kim Bauer is a 36 y.o. female with a history of plantar fasciitis of the left foot presenting today with continued left heel pain.  Pain described as the exact same pain posterior plantar fasciitis, and has increased over the last week.  Worse in the mornings.  Last seen by a foot and ankle specialist in 04/2022, who provided a steroid injection in the heel and diagnosed her with plantar fasciitis.  Images were done at this time.  Denies any new injury although states she is increased her walking/activity at work in this time.  She is able to bear weight, though states it is somewhat painful.  Denies fevers, joint stiffness, discoloration, numbness or tingling of the extremity.  The history is provided by the patient and medical records.  Foot Pain       Home Medications Prior to Admission medications   Medication Sig Start Date End Date Taking? Authorizing Provider  HYDROcodone-homatropine (HYCODAN) 5-1.5 MG/5ML syrup Take 5 mLs by mouth every 6 (six) hours as needed for cough. Patient not taking: Reported on 08/21/2020 12/16/18   Vanessa Kick, MD  ibuprofen (ADVIL) 600 MG tablet Take 1 tablet (600 mg total) by mouth every 6 (six) hours as needed. Patient not taking: Reported on 08/21/2020 04/26/20   Wieters, Madelynn Done C, PA-C  meloxicam (MOBIC) 15 MG tablet Take 1 tablet (15 mg total) by mouth daily. 0/1/09   Prince Rome, PA-C  methocarbamol (ROBAXIN) 500 MG tablet Take 1 tablet (500 mg total) by mouth 2 (two) times daily as needed for muscle spasms. 03/20/22   Rayna Sexton, PA-C      Allergies    Patient has no known allergies.    Review of Systems   Review of Systems  Musculoskeletal:        Left heel pain    Physical Exam Updated Vital Signs BP 137/84   Pulse 80   Temp 99.1 F  (37.3 C) (Oral)   Resp 16   Ht '5\' 5"'$  (1.651 m)   Wt 90.3 kg   LMP 10/29/2016 (Approximate) Comment: neg preg test  SpO2 97%   BMI 33.12 kg/m  Physical Exam Vitals and nursing note reviewed.  Constitutional:      General: She is not in acute distress.    Appearance: She is well-developed.  HENT:     Head: Normocephalic and atraumatic.  Eyes:     General: No scleral icterus.    Conjunctiva/sclera: Conjunctivae normal.  Cardiovascular:     Rate and Rhythm: Normal rate and regular rhythm.     Pulses: Normal pulses.     Comments: DP and PT pulses 2+ bilaterally.  CRT less than 2. Pulmonary:     Effort: Pulmonary effort is normal. No respiratory distress.  Abdominal:     Palpations: Abdomen is soft.  Musculoskeletal:        General: Tenderness present. No swelling, deformity or signs of injury.     Cervical back: Neck supple.     Right lower leg: No edema.     Left lower leg: No edema.     Comments: Tenderness of the left plantar surface, near the calcaneus.  ROM of digits and ankle appear unaffected.  Strength 5/5.  Appears neurovascularly intact.  Without evidence of obvious deformity, dislocation, ecchymosis,  rash, or superficial skin changes.  Without lower extremity erythema, warmth, swelling, or tenderness.  Skin:    General: Skin is warm and dry.     Capillary Refill: Capillary refill takes less than 2 seconds.     Coloration: Skin is not jaundiced or pale.     Findings: No bruising, erythema or rash.  Neurological:     Mental Status: She is alert and oriented to person, place, and time.  Psychiatric:        Mood and Affect: Mood normal.     ED Results / Procedures / Treatments   Labs (all labs ordered are listed, but only abnormal results are displayed) Labs Reviewed - No data to display  EKG None  Radiology No results found.  Procedures Procedures    Medications Ordered in ED Medications - No data to display  ED Course/ Medical Decision Making/ A&P                            Medical Decision Making Risk Prescription drug management.   36 y.o. female presents to the ED for concern of Foot Pain   This involves an extensive number of treatment options, and is a complaint that carries with it a high risk of complications and morbidity.  The emergent differential diagnosis prior to evaluation includes, but is not limited to: Planter fasciitis, contusion, sprain, fracture  This is not an exhaustive differential.   Past Medical History / Co-morbidities / Social History: Hx of tobacco use Social Determinants of Health include: tobacco use for which cessation counseling was provided  Additional History:  None  Lab Tests: None  Imaging Studies: None  ED Course: Pt well-appearing on exam.  Nonseptic, not ill-appearing in NAD.  Presenting today with left heel pain.  No recent fall or traumatic injury.  Recent diagnosis of plantar fasciitis in 04/2022 when seen by foot and ankle specialist, was provided steroid injection and NSAIDs.  Was supposed to follow-up for reevaluation, but appears to have been lost to follow-up.  With increased ambulation/activities over the last week, has noticed increased soreness in the heel.  Clinical picture, history, and recent diagnosis of plantar fasciitis appear consistent.  Low suspicion for acute fracture or sprain.  Lower extremities appear neurovascularly intact.  Considered imaging, however recent x-rays from May negative for fracture, dislocation, swelling, or degenerative changes.  Again no new injury.  Low suspicion of ischemia, compartment syndrome, or DVT.  Patient admits to not utilizing stretches or NSAIDs as recommended by the foot and ankle specialist.  Instructed to follow-up with foot and ankle specialist for reevaluation and continued medical management.  Provided education of Planter fasciitis and stretches/rehabilitation exercises.  Short refill for patient's Mobic provided.  Patient satisfied  with today's encounter.  Patient in NAD and in good condition at time of discharge.    Disposition: After consideration the patient's encounter today, I do not feel today's workup suggests an emergent condition requiring admission or immediate intervention beyond what has been performed at this time.  Safe for discharge; instructed to return immediately for worsening symptoms, change in symptoms or any other concerns.  I have reviewed the patients home medicines and have made adjustments as needed.  Discussed course of treatment with the patient, whom demonstrated understanding.  Patient in agreement and has no further questions.     This chart was dictated using voice recognition software.  Despite best efforts to proofread, errors can occur which  can change the documentation meaning.         Final Clinical Impression(s) / ED Diagnoses Final diagnoses:  Plantar fasciitis of left foot  Tobacco use    Rx / DC Orders ED Discharge Orders          Ordered    meloxicam (MOBIC) 15 MG tablet  Daily        07/24/22 0648              Prince Rome, PA-C 51/83/35 8251    Teressa Lower, MD 07/31/22 [phone]

## 2022-07-24 NOTE — ED Triage Notes (Signed)
Patient c/o left foot pain x 1 week, worse this AM.  Patient denies injury/trauma.

## 2022-07-24 NOTE — Discharge Instructions (Addendum)
Please review the attached information on Planter fasciitis and utilize the "frozen water bottle stretching method" as discussed.  A refill for the Mobic, and anti-inflammatory prescribed to you by the foot/ankle specialist, has been sent to your pharmacy.  Please take 1 tablet/day, with plenty of food and water for pain relief.  Do not take ibuprofen or Motrin at the same time as this medication, stay on the same class of medications.  Please schedule a follow-up appointment with Dr. Sherryle Lis, the foot and ankle specialist you saw 2 months ago.  His contact information has been provided for you down below.  Please discuss with him possibility of utilizing a night splint for further symptom relief.  Return to the ED for new or worsening symptoms as discussed.

## 2022-10-13 IMAGING — CR DG KNEE COMPLETE 4+V*R*
4 series · 4 of 4 positions shown · non-contrast
Comparison: None.

CLINICAL DATA: 35-year-old female with history of right knee pain
for the past 24 hours. No history of injury.

EXAM:
RIGHT KNEE - COMPLETE 4+ VIEW

[x knee ap right]
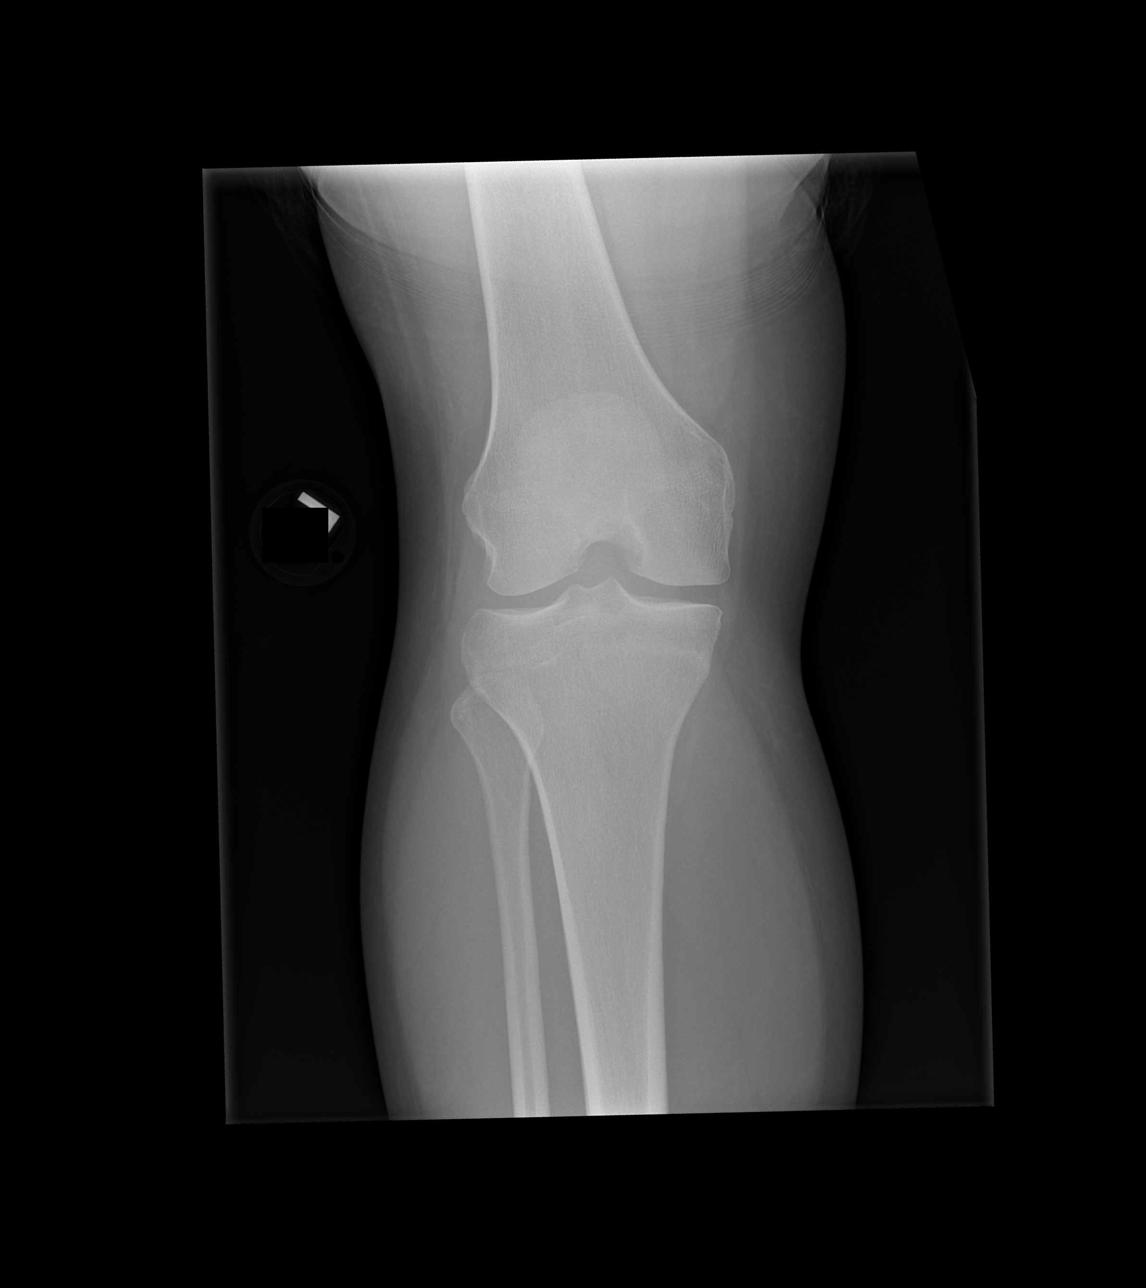

[x knee obl right (1 of 2)]
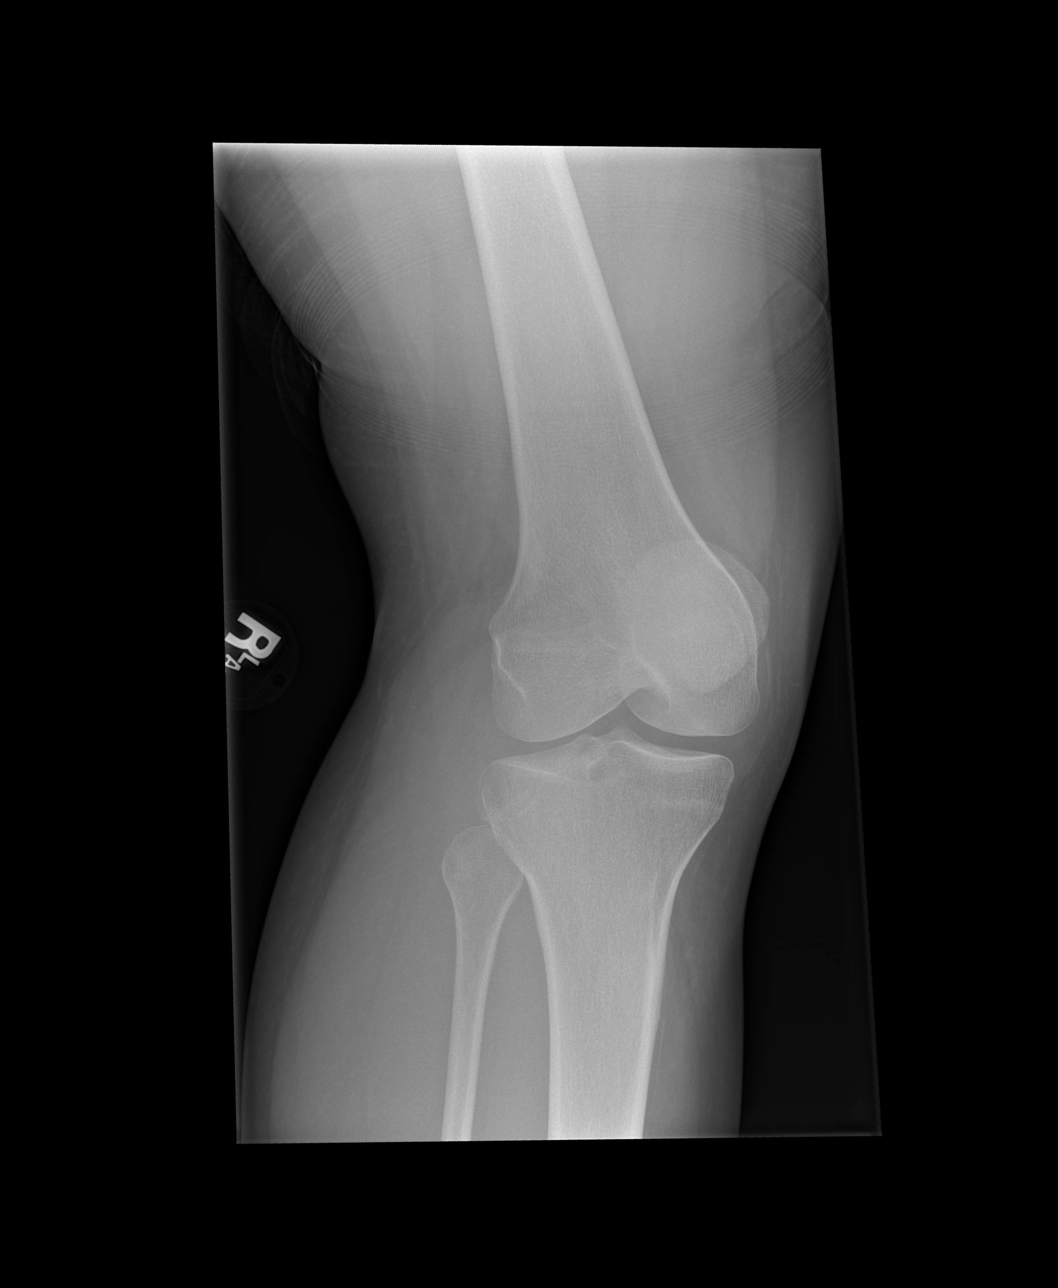

[x knee obl right (2 of 2)]
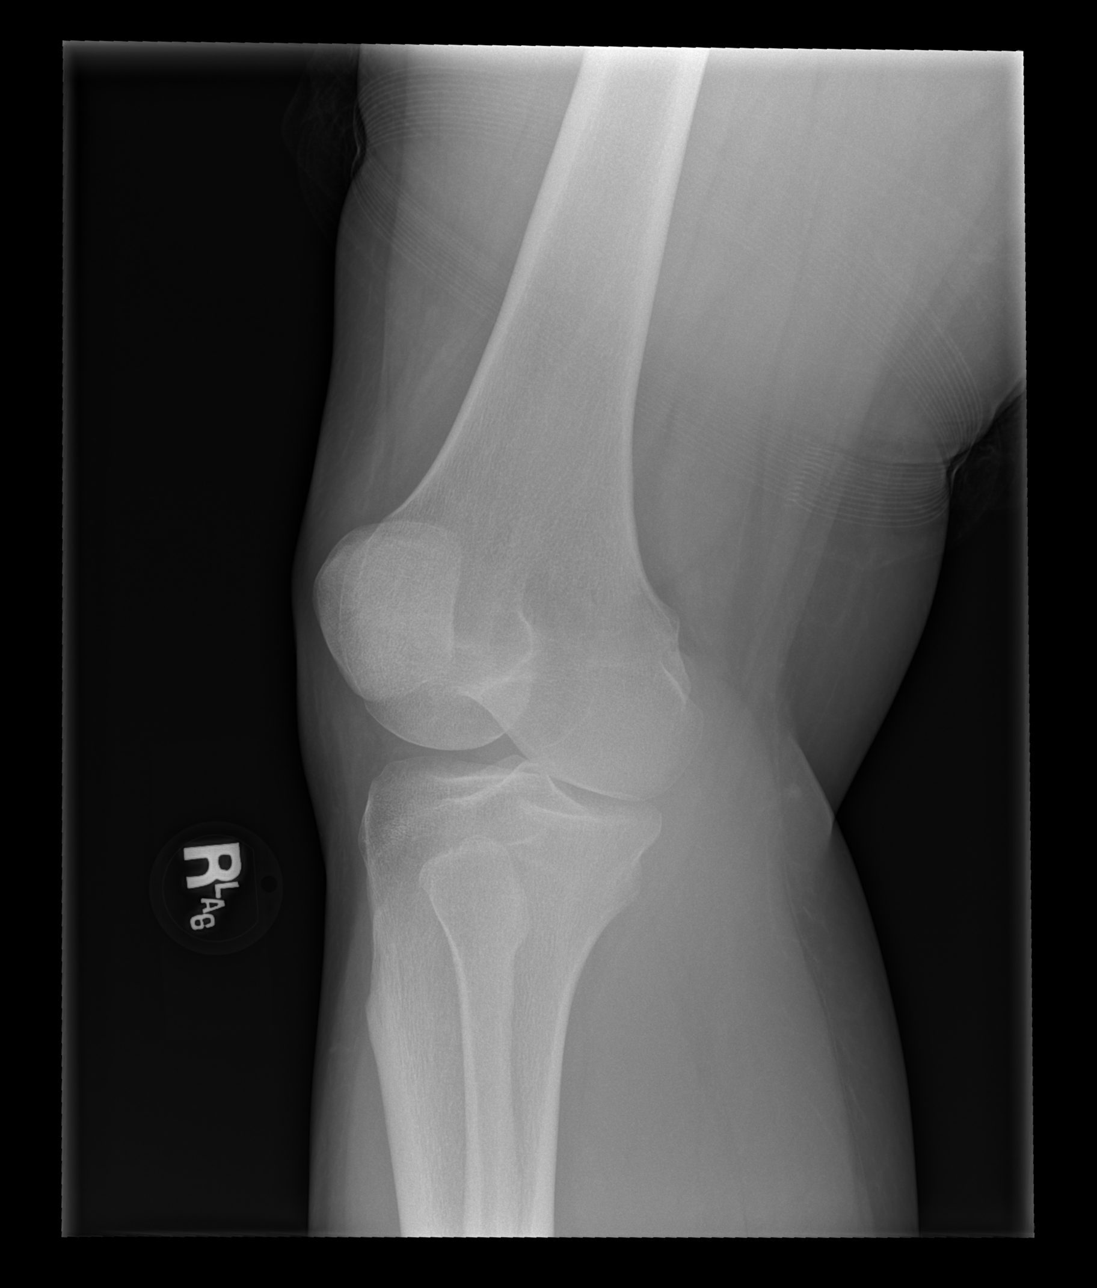

[x knee lat right]
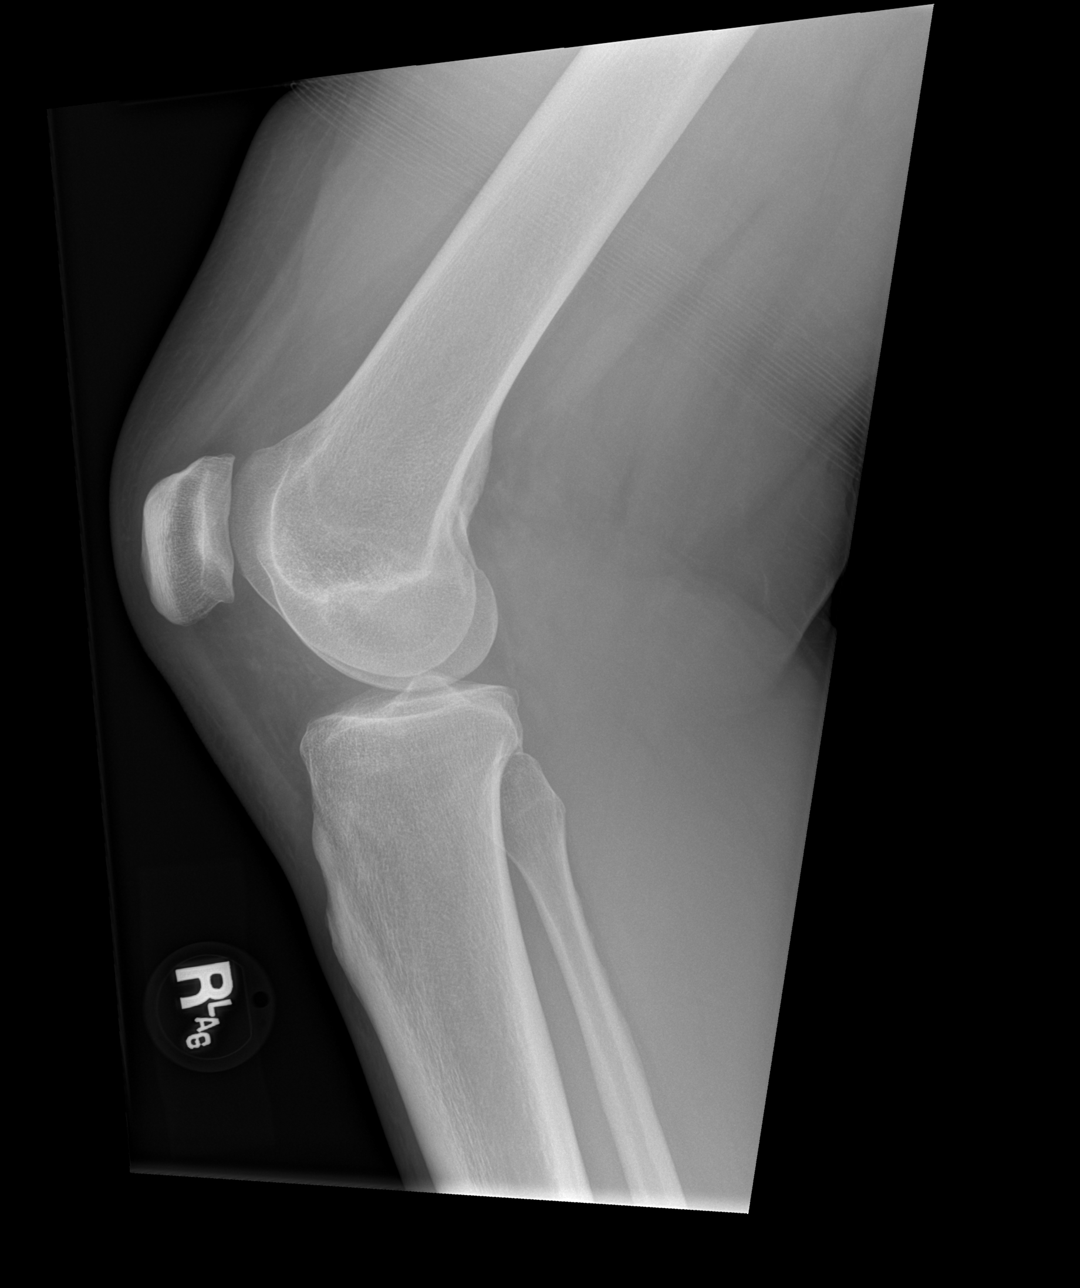

[4 of 4 positions shown; findings below may reference images not displayed]

FINDINGS: No evidence of fracture, dislocation, or joint effusion. No evidence
of arthropathy or other focal bone abnormality. Soft tissues are
unremarkable.
IMPRESSION: Negative.

## 2023-01-15 DIAGNOSIS — Z716 Tobacco abuse counseling: Secondary | ICD-10-CM | POA: Diagnosis not present

## 2023-01-15 DIAGNOSIS — Z13220 Encounter for screening for lipoid disorders: Secondary | ICD-10-CM | POA: Diagnosis not present

## 2023-01-15 DIAGNOSIS — F17200 Nicotine dependence, unspecified, uncomplicated: Secondary | ICD-10-CM | POA: Diagnosis not present

## 2023-01-15 DIAGNOSIS — H6121 Impacted cerumen, right ear: Secondary | ICD-10-CM | POA: Diagnosis not present

## 2023-01-15 DIAGNOSIS — Z0000 Encounter for general adult medical examination without abnormal findings: Secondary | ICD-10-CM | POA: Diagnosis not present

## 2023-01-15 DIAGNOSIS — Z862 Personal history of diseases of the blood and blood-forming organs and certain disorders involving the immune mechanism: Secondary | ICD-10-CM | POA: Diagnosis not present

## 2023-02-01 ENCOUNTER — Ambulatory Visit: Payer: BC Managed Care – PPO | Admitting: Family Medicine

## 2023-08-12 ENCOUNTER — Ambulatory Visit (HOSPITAL_COMMUNITY)
Admission: EM | Admit: 2023-08-12 | Discharge: 2023-08-12 | Disposition: A | Discharge status: Home / Self Care | Payer: BC Managed Care – PPO | Attending: Family Medicine | Admitting: Family Medicine

## 2023-08-12 ENCOUNTER — Encounter (HOSPITAL_COMMUNITY): Payer: Self-pay

## 2023-08-12 DIAGNOSIS — U071 COVID-19: Secondary | ICD-10-CM | POA: Insufficient documentation

## 2023-08-12 DIAGNOSIS — J069 Acute upper respiratory infection, unspecified: Secondary | ICD-10-CM

## 2023-08-12 MED ORDER — HYDROCODONE BIT-HOMATROP MBR 5-1.5 MG/5ML PO SOLN: 5.0000 mL | Freq: Four times a day (QID) | ORAL | 0 refills | Status: DC | PRN

## 2023-08-12 MED ORDER — IBUPROFEN 800 MG PO TABS: 800.0000 mg | ORAL_TABLET | Freq: Three times a day (TID) | ORAL | 0 refills | Status: DC

## 2023-08-12 NOTE — ED Triage Notes (Signed)
Pt presents to UC w/ c/o chills, diarrhea, headache, body aches, fever, fatigue x1 day. Pt took ibuprofen and nyquil for symptom relief.

## 2023-08-12 NOTE — Discharge Instructions (Addendum)
Be aware, your cough medication may cause drowsiness. Please do not drive, operate heavy machinery or make important decisions while on this medication, it can cloud your judgement.  You have been tested for COVID-19 today. If your test returns positive, you will receive a phone call from [facility] regarding your results. Negative test results are not called. Both positive and negative results area always visible on MyChart. If you do not have a MyChart account, sign up instructions are provided in your discharge papers. Please do not hesitate to contact us should you have questions or concerns.  

## 2023-08-13 LAB — SARS CORONAVIRUS 2 (TAT 6-24 HRS): SARS Coronavirus 2: POSITIVE — AB

## 2023-08-14 ENCOUNTER — Telehealth (HOSPITAL_COMMUNITY): Payer: Self-pay | Admitting: Emergency Medicine
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
## 2023-08-14 NOTE — Telephone Encounter (Signed)
Patient left voicemail stating she will need a note to cover her for 7 days out of work.  Reviewed chart, Dr. Tracie Harrier provided a note on day of visit, and per CDC guidelines, patient is able to return at that time.  Reviewed with patient, she states she will need to bring by [facility] paperwork anyway.  I explained that if she was out prior to her visit with [place], that we can only cover her for the three days we put her out for, which means she would not fall under FMLA, and we would not be able to fill out the paperwork.  She verbalized understanding, and will follow up with her job/PCP

## 2023-10-06 ENCOUNTER — Emergency Department (HOSPITAL_COMMUNITY): Payer: BC Managed Care – PPO

## 2023-10-06 ENCOUNTER — Other Ambulatory Visit: Payer: Self-pay

## 2023-10-06 ENCOUNTER — Encounter (HOSPITAL_COMMUNITY): Payer: Self-pay

## 2023-10-06 ENCOUNTER — Emergency Department (HOSPITAL_COMMUNITY)
Admission: EM | Admit: 2023-10-06 | Discharge: 2023-10-06 | Disposition: A | Discharge status: Home / Self Care | Payer: BC Managed Care – PPO | Attending: Emergency Medicine | Admitting: Emergency Medicine

## 2023-10-06 DIAGNOSIS — Y9341 Activity, dancing: Secondary | ICD-10-CM | POA: Insufficient documentation

## 2023-10-06 DIAGNOSIS — S8262XA Displaced fracture of lateral malleolus of left fibula, initial encounter for closed fracture: Secondary | ICD-10-CM | POA: Diagnosis not present

## 2023-10-06 DIAGNOSIS — S8265XA Nondisplaced fracture of lateral malleolus of left fibula, initial encounter for closed fracture: Secondary | ICD-10-CM | POA: Insufficient documentation

## 2023-10-06 DIAGNOSIS — X501XXA Overexertion from prolonged static or awkward postures, initial encounter: Secondary | ICD-10-CM | POA: Insufficient documentation

## 2023-10-06 DIAGNOSIS — M25572 Pain in left ankle and joints of left foot: Secondary | ICD-10-CM | POA: Diagnosis not present

## 2023-10-06 DIAGNOSIS — M7732 Calcaneal spur, left foot: Secondary | ICD-10-CM | POA: Diagnosis not present

## 2023-10-06 DIAGNOSIS — S82832A Other fracture of upper and lower end of left fibula, initial encounter for closed fracture: Secondary | ICD-10-CM | POA: Diagnosis not present

## 2023-10-06 MED ORDER — HYDROCODONE-ACETAMINOPHEN 5-325 MG PO TABS: 1.0000 | ORAL_TABLET | Freq: Four times a day (QID) | ORAL | 0 refills | Status: AC | PRN

## 2023-10-06 MED ORDER — ACETAMINOPHEN 325 MG PO TABS
650.0000 mg | ORAL_TABLET | Freq: Once | ORAL | Status: AC
  Administered 2023-10-06: 650 mg via ORAL
  Filled 2023-10-06: qty 2

## 2023-10-06 NOTE — Discharge Instructions (Signed)
Please follow-up with the orthopedic specialist I have attached you for you today.  Today your x-ray does show you have a fracture on the lateral aspect of your ankle and you are placed in a splint and given crutches.  You may take Tylenol ibuprofen every 6 hours needed for pain.  I have prescribed you Norco to take if pain is not controlled by the over-the-counter anti-inflammatories.  Please rest the next few days and symptoms change or worsen please return to ER.

## 2023-10-06 NOTE — Progress Notes (Signed)
Orthopedic Tech Progress Note Patient Details:  Josefine Tuckey [birth year] [id number]  Ortho Devices Type of Ortho Device: Post splint, Stirrup splint Ortho Device/Splint Location: LLE Ortho Device/Splint Interventions: Ordered, Application, Adjustment   Post Interventions Patient Tolerated: Well Instructions Provided: Care of device, Poper ambulation with device  Tonye Pearson 10/06/2023, 11:25 AM

## 2023-10-06 NOTE — ED Provider Notes (Cosign Needed Addendum)
[place] EMERGENCY DEPARTMENT AT [facility] Provider Note   CSN: [id number] Arrival date & time: 10/06/23  4098     History  Chief Complaint  Patient presents with   Ankle Pain    Kim Bauer is a 37 y.o. female with no pertinent past medical history presented after hurting her left ankle last night.  Patient was trying to replicate dirty dancing when she rolled her ankle by inverting it.  Patient states that hurts to bear weight and can move her ankle slightly but states that it hurts to move it.  Patient still feel her foot and denies skin color changes or swelling.  Patient not taking any pain meds for this.  Patient denies blood thinners or deformity.  Patient does endorse hearing a popping sensation says she is unable to put pressure on the foot due to pain.  Patient did fall after hearing the popping sensation however landed on her butt and did not hit her head and denies any pelvic pain and wants to be assessed for her left ankle pain.  Home Medications Prior to Admission medications   Medication Sig Start Date End Date Taking? Authorizing Provider  HYDROcodone-acetaminophen (NORCO) 5-325 MG tablet Take 1 tablet by mouth every 6 (six) hours as needed for up to 5 days for moderate pain (pain score 4-6). 10/06/23 10/11/23 Yes Lauriana Denes, Beverly Gust, PA-C  ibuprofen (ADVIL) 800 MG tablet Take 1 tablet (800 mg total) by mouth 3 (three) times daily with meals. 08/12/23   Mardella Layman, MD  methocarbamol (ROBAXIN) 500 MG tablet Take 1 tablet (500 mg total) by mouth 2 (two) times daily as needed for muscle spasms. 03/20/22   Placido Sou, PA-C      Allergies    Patient has no known allergies.    Review of Systems   Review of Systems  Physical Exam Updated Vital Signs BP 128/82   Pulse 78   Temp 98.4 F (36.9 C) (Oral)   Resp 18   Ht 5\' 5"  (1.651 m)   Wt 83.9 kg   LMP 10/29/2016 (Approximate) Comment: neg preg test  SpO2 100%   BMI 30.79 kg/m  Physical  Exam Vitals reviewed.  Constitutional:      General: She is not in acute distress. Cardiovascular:     Rate and Rhythm: Normal rate.     Pulses: Normal pulses.  Musculoskeletal:     Comments: Left ankle: Tenderness to lateral malleolus with possible step-off, negative anterior drawer test, negative inversion stress test, able to wiggle toes, 4 out of 5 plantarflexion/dorsiflexion, no proximal fibular tenderness or abnormalities Pain not out of proportion Soft compartments  Skin:    General: Skin is warm and dry.     Capillary Refill: Capillary refill takes less than 2 seconds.     Comments: No signs of open fracture  Neurological:     Mental Status: She is alert.     Comments: Sensation intact distally  Psychiatric:        Mood and Affect: Mood normal.     ED Results / Procedures / Treatments   Labs (all labs ordered are listed, but only abnormal results are displayed) Labs Reviewed - No data to display  EKG None  Radiology DG Ankle Left Port  Result Date: 10/06/2023 CLINICAL DATA:  144615 Pain 144615; pain after injury EXAM: PORTABLE LEFT ANKLE - 2 VIEW; LEFT TIBIA AND FIBULA - 2 VIEW COMPARISON:  None Available. FINDINGS: Bone mineralization within normal limits for patient's age.  There is oblique undisplaced fracture of the lateral malleolus. No other acute fracture or dislocation. No aggressive osseous lesion. Ankle mortise appears intact. No significant degenerative changes. Calcaneal spur noted along the Achilles tendon and Plantar aponeurosis attachment sites. No focal soft tissue swelling. No radiopaque foreign bodies. IMPRESSION: 1. Oblique undisplaced fracture of the lateral malleolus. Electronically Signed   By: Jules Schick M.D.   On: 10/06/2023 08:49   DG Tibia/Fibula Left  Result Date: 10/06/2023 CLINICAL DATA:  144615 Pain 144615; pain after injury EXAM: PORTABLE LEFT ANKLE - 2 VIEW; LEFT TIBIA AND FIBULA - 2 VIEW COMPARISON:  None Available. FINDINGS: Bone  mineralization within normal limits for patient's age. There is oblique undisplaced fracture of the lateral malleolus. No other acute fracture or dislocation. No aggressive osseous lesion. Ankle mortise appears intact. No significant degenerative changes. Calcaneal spur noted along the Achilles tendon and Plantar aponeurosis attachment sites. No focal soft tissue swelling. No radiopaque foreign bodies. IMPRESSION: 1. Oblique undisplaced fracture of the lateral malleolus. Electronically Signed   By: Jules Schick M.D.   On: 10/06/2023 08:49    Procedures Procedures    Medications Ordered in ED Medications  acetaminophen (TYLENOL) tablet 650 mg (650 mg Oral Given 10/06/23 0914)    ED Course/ Medical Decision Making/ A&P                                 Medical Decision Making Amount and/or Complexity of Data Reviewed Radiology: ordered.  Risk OTC drugs. Prescription drug management.   Kim Bauer 37 y.o. presented today for left ankle pain. Working DDx that I considered at this time includes, but not limited to, contusion, strain/sprain, fracture, dislocation, neurovascular compromise, septic joint, ischemic limb, compartment syndrome.  R/o DDx: contusion, strain/sprain, dislocation, neurovascular compromise, septic joint, ischemic limb, compartment syndrome: These are considered less likely due to history of present illness, physical exam, labs/imaging findings.  Review of prior external notes: 08/12/2023 ED  Unique Tests and My Interpretation:  Left ankle x-ray: Oblique lateral malleolus fracture Left foot x-ray: Unremarkable  Discussion with Independent Historian:  Significant other  Discussion of Management of Tests: None  Risk: Medium: prescription drug management  Risk Stratification Score: [place] ankle rules, positive  Staffed with Zackowski, MD  Plan: On exam patient was in no acute distress with stable vitals.  Patient's exam does show lateral malleolus tile  tenderness with possible step-off.  Patient is neuro vastly intact with no signs of open fracture and the rest of her exam was unremarkable.  X-rays do confirm lateral malleolus fracture that is oblique and will place in sugar-tong with crutches with orthopedic follow-up.  Will encourage anti-inflammatories every 6 hours and will prescribe Norco for pain not controlled by this.  Patient given return precautions.  No signs of open fracture.  Patient is no longer taking the Norco cough syrup and so we will continue with the Norco.  Patient was verbally educated on compartment syndrome, decree sensation, pain out of proportion, skin color changes, etc. and to return to ER symptoms are change or worsen.  Will give a work note at patient's request.  Patient was given return precautions. Patient stable for discharge at this time.  Patient verbalized understanding of plan.  This chart was dictated using voice recognition software.  Despite best efforts to proofread,  errors can occur which can change the documentation meaning.         Final  Clinical Impression(s) / ED Diagnoses Final diagnoses:  Closed fracture of distal lateral malleolus of left fibula, initial encounter    Rx / DC Orders ED Discharge Orders          Ordered    HYDROcodone-acetaminophen (NORCO) 5-325 MG tablet  Every 6 hours PRN        10/06/23 0933              Netta Corrigan, PA-C 10/06/23 0935    Netta Corrigan, PA-C 10/06/23 0935    Vanetta Mulders, MD 10/09/23 1524

## 2023-10-06 NOTE — ED Triage Notes (Signed)
Patient is here for evaluation of left ankle pain. Pt reports falling yesterday, family reports hearing a pop. PT reports unable to put pressure on foot.

## 2023-10-11 DIAGNOSIS — S82892A Other fracture of left lower leg, initial encounter for closed fracture: Secondary | ICD-10-CM | POA: Diagnosis not present

## 2023-10-14 ENCOUNTER — Encounter (HOSPITAL_BASED_OUTPATIENT_CLINIC_OR_DEPARTMENT_OTHER): Payer: Self-pay | Admitting: Orthopedic Surgery

## 2023-10-14 ENCOUNTER — Other Ambulatory Visit: Payer: Self-pay
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
## 2023-10-18 ENCOUNTER — Other Ambulatory Visit: Payer: Self-pay

## 2023-10-18 ENCOUNTER — Ambulatory Visit (HOSPITAL_BASED_OUTPATIENT_CLINIC_OR_DEPARTMENT_OTHER): Payer: BC Managed Care – PPO

## 2023-10-18 ENCOUNTER — Encounter (HOSPITAL_BASED_OUTPATIENT_CLINIC_OR_DEPARTMENT_OTHER)
Admission: RE | Disposition: A | Discharge status: Home / Self Care | Payer: Self-pay | Source: Home / Self Care | Attending: Orthopedic Surgery

## 2023-10-18 ENCOUNTER — Ambulatory Visit (HOSPITAL_BASED_OUTPATIENT_CLINIC_OR_DEPARTMENT_OTHER): Payer: BC Managed Care – PPO | Admitting: Anesthesiology

## 2023-10-18 ENCOUNTER — Encounter (HOSPITAL_BASED_OUTPATIENT_CLINIC_OR_DEPARTMENT_OTHER): Payer: Self-pay | Admitting: Orthopedic Surgery

## 2023-10-18 ENCOUNTER — Ambulatory Visit (HOSPITAL_BASED_OUTPATIENT_CLINIC_OR_DEPARTMENT_OTHER)
Admission: RE | Admit: 2023-10-18 | Discharge: 2023-10-18 | Disposition: A | Discharge status: Home / Self Care | Payer: BC Managed Care – PPO | Attending: Orthopedic Surgery | Admitting: Orthopedic Surgery

## 2023-10-18 DIAGNOSIS — S93432A Sprain of tibiofibular ligament of left ankle, initial encounter: Secondary | ICD-10-CM | POA: Diagnosis not present

## 2023-10-18 DIAGNOSIS — X58XXXA Exposure to other specified factors, initial encounter: Secondary | ICD-10-CM | POA: Diagnosis not present

## 2023-10-18 DIAGNOSIS — F17290 Nicotine dependence, other tobacco product, uncomplicated: Secondary | ICD-10-CM | POA: Diagnosis not present

## 2023-10-18 DIAGNOSIS — S82892A Other fracture of left lower leg, initial encounter for closed fracture: Secondary | ICD-10-CM | POA: Diagnosis not present

## 2023-10-18 DIAGNOSIS — G8918 Other acute postprocedural pain: Secondary | ICD-10-CM | POA: Diagnosis not present

## 2023-10-18 DIAGNOSIS — S8262XA Displaced fracture of lateral malleolus of left fibula, initial encounter for closed fracture: Secondary | ICD-10-CM | POA: Insufficient documentation

## 2023-10-18 HISTORY — PX: ORIF ANKLE FRACTURE: SHX5408

## 2023-10-18 SURGERY — OPEN REDUCTION INTERNAL FIXATION (ORIF) ANKLE FRACTURE
Anesthesia: Regional | Site: Ankle | Laterality: Left

## 2023-10-18 MED ORDER — ONDANSETRON HCL 4 MG/2ML IJ SOLN
INTRAMUSCULAR | Status: AC
  Filled 2023-10-18: qty 2

## 2023-10-18 MED ORDER — LACTATED RINGERS IV SOLN: INTRAVENOUS | Status: DC

## 2023-10-18 MED ORDER — ACETAMINOPHEN 500 MG PO TABS
1000.0000 mg | ORAL_TABLET | Freq: Once | ORAL | Status: AC
Start: 2023-10-18 — End: 2023-10-18
  Administered 2023-10-18: 1000 mg via ORAL

## 2023-10-18 MED ORDER — POVIDONE-IODINE 10 % EX SWAB
2.0000 | Freq: Once | CUTANEOUS | Status: AC
  Administered 2023-10-18: 2 via TOPICAL

## 2023-10-18 MED ORDER — ACETAMINOPHEN 500 MG PO TABS: 1000.0000 mg | ORAL_TABLET | Freq: Once | ORAL | Status: AC

## 2023-10-18 MED ORDER — FENTANYL CITRATE (PF) 100 MCG/2ML IJ SOLN
INTRAMUSCULAR | Status: DC | PRN
  Administered 2023-10-18: 50 ug via INTRAVENOUS

## 2023-10-18 MED ORDER — DEXAMETHASONE SODIUM PHOSPHATE 4 MG/ML IJ SOLN
INTRAMUSCULAR | Status: DC | PRN
  Administered 2023-10-18: 5 mg via INTRAVENOUS

## 2023-10-18 MED ORDER — FENTANYL CITRATE (PF) 100 MCG/2ML IJ SOLN: 25.0000 ug | INTRAMUSCULAR | Status: DC | PRN

## 2023-10-18 MED ORDER — DEXAMETHASONE SODIUM PHOSPHATE 10 MG/ML IJ SOLN
INTRAMUSCULAR | Status: AC
  Filled 2023-10-18: qty 1

## 2023-10-18 MED ORDER — ACETAMINOPHEN 500 MG PO TABS
ORAL_TABLET | ORAL | Status: AC
  Filled 2023-10-18: qty 2

## 2023-10-18 MED ORDER — LIDOCAINE HCL (CARDIAC) PF 100 MG/5ML IV SOSY
PREFILLED_SYRINGE | INTRAVENOUS | Status: DC | PRN
  Administered 2023-10-18: 50 mg via INTRAVENOUS

## 2023-10-18 MED ORDER — MIDAZOLAM HCL 2 MG/2ML IJ SOLN
2.0000 mg | Freq: Once | INTRAMUSCULAR | Status: AC
  Administered 2023-10-18: 2 mg via INTRAVENOUS

## 2023-10-18 MED ORDER — ASPIRIN 81 MG PO TBEC: 81.0000 mg | DELAYED_RELEASE_TABLET | Freq: Two times a day (BID) | ORAL | 0 refills | Status: DC

## 2023-10-18 MED ORDER — FENTANYL CITRATE (PF) 100 MCG/2ML IJ SOLN
INTRAMUSCULAR | Status: AC
  Filled 2023-10-18: qty 2

## 2023-10-18 MED ORDER — MIDAZOLAM HCL 2 MG/2ML IJ SOLN
INTRAMUSCULAR | Status: AC
  Filled 2023-10-18: qty 2

## 2023-10-18 MED ORDER — METHOCARBAMOL 500 MG PO TABS: 500.0000 mg | ORAL_TABLET | Freq: Three times a day (TID) | ORAL | 0 refills | Status: DC | PRN

## 2023-10-18 MED ORDER — CEFAZOLIN SODIUM-DEXTROSE 2-4 GM/100ML-% IV SOLN
INTRAVENOUS | Status: AC
  Filled 2023-10-18: qty 100

## 2023-10-18 MED ORDER — ACETAMINOPHEN 500 MG PO TABS: 1000.0000 mg | ORAL_TABLET | Freq: Four times a day (QID) | ORAL | 0 refills | Status: DC | PRN

## 2023-10-18 MED ORDER — SODIUM CHLORIDE 0.9 % IV SOLN: INTRAVENOUS | Status: DC | PRN

## 2023-10-18 MED ORDER — ONDANSETRON HCL 4 MG/2ML IJ SOLN
INTRAMUSCULAR | Status: DC | PRN
  Administered 2023-10-18: 4 mg via INTRAVENOUS

## 2023-10-18 MED ORDER — DEXAMETHASONE SODIUM PHOSPHATE 10 MG/ML IJ SOLN
INTRAMUSCULAR | Status: DC | PRN
  Administered 2023-10-18 (×2): 5 mg

## 2023-10-18 MED ORDER — FENTANYL CITRATE (PF) 100 MCG/2ML IJ SOLN
50.0000 ug | Freq: Once | INTRAMUSCULAR | Status: AC
  Administered 2023-10-18: 50 ug via INTRAVENOUS

## 2023-10-18 MED ORDER — 0.9 % SODIUM CHLORIDE (POUR BTL) OPTIME
TOPICAL | Status: DC | PRN
  Administered 2023-10-18: 200 mL

## 2023-10-18 MED ORDER — ONDANSETRON 4 MG PO TBDP: 4.0000 mg | ORAL_TABLET | Freq: Three times a day (TID) | ORAL | 0 refills | Status: DC | PRN

## 2023-10-18 MED ORDER — OXYCODONE HCL 5 MG PO TABS: 5.0000 mg | ORAL_TABLET | Freq: Three times a day (TID) | ORAL | 0 refills | Status: DC | PRN

## 2023-10-18 MED ORDER — CEFAZOLIN SODIUM-DEXTROSE 2-4 GM/100ML-% IV SOLN
2.0000 g | INTRAVENOUS | Status: AC
  Administered 2023-10-18: 2 g via INTRAVENOUS

## 2023-10-18 MED ORDER — PROPOFOL 10 MG/ML IV BOLUS
INTRAVENOUS | Status: DC | PRN
  Administered 2023-10-18: 200 mg via INTRAVENOUS

## 2023-10-18 MED ORDER — DEXAMETHASONE SODIUM PHOSPHATE 10 MG/ML IJ SOLN: 8.0000 mg | Freq: Once | INTRAMUSCULAR | Status: DC

## 2023-10-18 MED ORDER — MIDAZOLAM HCL 5 MG/5ML IJ SOLN
INTRAMUSCULAR | Status: DC | PRN
  Administered 2023-10-18: 2 mg via INTRAVENOUS

## 2023-10-18 MED ORDER — ROPIVACAINE HCL 5 MG/ML IJ SOLN
INTRAMUSCULAR | Status: DC | PRN
  Administered 2023-10-18: 20 mL via PERINEURAL
  Administered 2023-10-18: 30 mL via PERINEURAL

## 2023-10-18 SURGICAL SUPPLY — 80 items
APL PRP STRL LF DISP 70% ISPRP (MISCELLANEOUS) ×1
BANDAGE ESMARK 6X9 LF (GAUZE/BANDAGES/DRESSINGS) ×1 IMPLANT
BIT DRILL 2.5X125 (BIT) IMPLANT
BIT DRILL 3.5X125 (BIT) IMPLANT
BLADE SURG 15 STRL LF DISP TIS (BLADE) ×2 IMPLANT
BLADE SURG 15 STRL SS (BLADE) ×2
BNDG CMPR 5X4 CHSV STRCH STRL (GAUZE/BANDAGES/DRESSINGS) ×1
BNDG CMPR 5X4 KNIT ELC UNQ LF (GAUZE/BANDAGES/DRESSINGS) ×1
BNDG CMPR 6 X 5 YARDS HK CLSR (GAUZE/BANDAGES/DRESSINGS) ×1
BNDG CMPR 9X6 STRL LF SNTH (GAUZE/BANDAGES/DRESSINGS) ×1
BNDG COHESIVE 4X5 TAN STRL LF (GAUZE/BANDAGES/DRESSINGS) ×1 IMPLANT
BNDG ELASTIC 4INX 5YD STR LF (GAUZE/BANDAGES/DRESSINGS) ×1 IMPLANT
BNDG ELASTIC 6INX 5YD STR LF (GAUZE/BANDAGES/DRESSINGS) ×1 IMPLANT
BNDG ESMARK 6X9 LF (GAUZE/BANDAGES/DRESSINGS) ×1
CHLORAPREP W/TINT 26 (MISCELLANEOUS) ×1 IMPLANT
CLSR STERI-STRIP ANTIMIC 1/2X4 (GAUZE/BANDAGES/DRESSINGS) ×1 IMPLANT
COVER BACK TABLE 60X90IN (DRAPES) ×1 IMPLANT
CUFF TOURN SGL QUICK 24 (TOURNIQUET CUFF)
CUFF TOURN SGL QUICK 34 (TOURNIQUET CUFF)
CUFF TRNQT CYL 24X4X16.5-23 (TOURNIQUET CUFF) IMPLANT
CUFF TRNQT CYL 34X4.125X (TOURNIQUET CUFF) IMPLANT
DRAPE EXTREMITY T 121X128X90 (DISPOSABLE) ×1 IMPLANT
DRAPE IMP U-DRAPE 54X76 (DRAPES) ×1 IMPLANT
DRAPE OEC MINIVIEW 54X84 (DRAPES) ×1 IMPLANT
DRAPE U-SHAPE 47X51 STRL (DRAPES) ×1 IMPLANT
DRILL BIT 3.5X125 (BIT) ×1
DRSG EMULSION OIL 3X3 NADH (GAUZE/BANDAGES/DRESSINGS) ×1 IMPLANT
ELECT REM PT RETURN 9FT ADLT (ELECTROSURGICAL) ×1
ELECTRODE REM PT RTRN 9FT ADLT (ELECTROSURGICAL) ×1 IMPLANT
GAUZE PAD ABD 8X10 STRL (GAUZE/BANDAGES/DRESSINGS) ×1 IMPLANT
GAUZE SPONGE 4X4 12PLY STRL (GAUZE/BANDAGES/DRESSINGS) ×1 IMPLANT
GLOVE BIO SURGEON STRL SZ7.5 (GLOVE) ×1 IMPLANT
GLOVE BIOGEL PI IND STRL 7.5 (GLOVE) ×1 IMPLANT
GLOVE BIOGEL PI IND STRL 8 (GLOVE) ×1 IMPLANT
GLOVE SURG SYN 7.5 E (GLOVE) ×1
GLOVE SURG SYN 7.5 PF PI (GLOVE) ×1 IMPLANT
GOWN STRL REUS W/ TWL LRG LVL3 (GOWN DISPOSABLE) ×2 IMPLANT
GOWN STRL REUS W/ TWL XL LVL3 (GOWN DISPOSABLE) ×1 IMPLANT
GOWN STRL REUS W/TWL LRG LVL3 (GOWN DISPOSABLE) ×2
GOWN STRL REUS W/TWL XL LVL3 (GOWN DISPOSABLE) ×1
IMPL TIGHTROP W/DRV K-LESS (Anchor) IMPLANT
IMPLANT TIGHTROPE W/DRV K-LESS (Anchor) ×1 IMPLANT
NDL HYPO 22X1.5 SAFETY MO (MISCELLANEOUS) IMPLANT
NEEDLE HYPO 22X1.5 SAFETY MO (MISCELLANEOUS)
NS IRRIG 1000ML POUR BTL (IV SOLUTION) ×1 IMPLANT
PACK BASIN DAY SURGERY FS (CUSTOM PROCEDURE TRAY) ×1 IMPLANT
PAD CAST 4YDX4 CTTN HI CHSV (CAST SUPPLIES) ×1 IMPLANT
PADDING CAST ABS COTTON 4X4 ST (CAST SUPPLIES) ×2 IMPLANT
PADDING CAST COTTON 4X4 STRL (CAST SUPPLIES) ×1
PADDING CAST COTTON 6X4 STRL (CAST SUPPLIES) ×1 IMPLANT
PENCIL SMOKE EVACUATOR (MISCELLANEOUS) ×1 IMPLANT
PLATE 1/3 TUBULAR 7H (Plate) IMPLANT
SCREW ASNIS COUNTERSINK (BIT) IMPLANT
SCREW CANC 2.5XFT HEX12X4X (Screw) IMPLANT
SCREW CANC FT 16X4X2.5XHEX (Screw) IMPLANT
SCREW CANCELLOUS 4.0X12MM (Screw) ×1 IMPLANT
SCREW CANCELLOUS 4.0X16MM (Screw) ×1 IMPLANT
SCREW CORTEX ST MATTA 3.5X14 (Screw) IMPLANT
SCREW CORTEX ST MATTA 3.5X16MM (Screw) IMPLANT
SCREW CORTEX ST MATTA 3.5X22MM (Screw) IMPLANT
SLEEVE SCD COMPRESS KNEE MED (STOCKING) IMPLANT
SPIKE FLUID TRANSFER (MISCELLANEOUS) IMPLANT
SPLINT PLASTER CAST FAST 5X30 (CAST SUPPLIES) ×20 IMPLANT
SPONGE T-LAP 4X18 ~~LOC~~+RFID (SPONGE) ×1 IMPLANT
SUCTION TUBE FRAZIER 10FR DISP (SUCTIONS) ×1 IMPLANT
SUT ETHILON 3 0 PS 1 (SUTURE) IMPLANT
SUT MNCRL AB 4-0 PS2 18 (SUTURE) IMPLANT
SUT MON AB 2-0 CT1 36 (SUTURE) IMPLANT
SUT MON AB 3-0 SH 27 (SUTURE)
SUT MON AB 3-0 SH27 (SUTURE) IMPLANT
SUT VIC AB 2-0 SH 27 (SUTURE)
SUT VIC AB 2-0 SH 27XBRD (SUTURE) IMPLANT
SUT VIC AB 3-0 SH 27 (SUTURE) ×1
SUT VIC AB 3-0 SH 27X BRD (SUTURE) IMPLANT
SUT VICRYL 0 SH 27 (SUTURE) ×1 IMPLANT
SYR BULB EAR ULCER 3OZ GRN STR (SYRINGE) ×1 IMPLANT
SYR CONTROL 10ML LL (SYRINGE) IMPLANT
TOWEL GREEN STERILE FF (TOWEL DISPOSABLE) ×2 IMPLANT
TUBE CONNECTING 20X1/4 (TUBING) ×1 IMPLANT
UNDERPAD 30X36 HEAVY ABSORB (UNDERPADS AND DIAPERS) ×1 IMPLANT

## 2023-10-18 NOTE — Transfer of Care (Signed)
Immediate Anesthesia Transfer of Care Note  Patient: Kim Bauer  Procedure(s) Performed: OPEN REDUCTION INTERNAL FIXATION (ORIF) ANKLE FRACTURE (Left: Ankle)  Patient Location: PACU  Anesthesia Type:GA combined with regional for post-op pain  Level of Consciousness: awake, drowsy, and patient cooperative  Airway & Oxygen Therapy: Patient Spontanous Breathing and Patient connected to face mask oxygen  Post-op Assessment: Report given to RN and Post -op Vital signs reviewed and stable  Post vital signs: Reviewed and stable  Last Vitals:  Vitals Value Taken Time  BP    Temp    Pulse    Resp    SpO2      Last Pain:  Vitals:   10/18/23 1021  TempSrc: Temporal  PainSc: 0-No pain      Patients Stated Pain Goal: 3 (10/18/23 1021)  Complications: No notable events documented.

## 2023-10-18 NOTE — Anesthesia Preprocedure Evaluation (Signed)
Anesthesia Evaluation  Patient identified by MRN, date of birth, ID band Patient awake    Reviewed: Allergy & Precautions, NPO status , Patient's Chart, lab work & pertinent test results  Airway Mallampati: II  TM Distance: >3 FB Neck ROM: Full    Dental no notable dental hx. (+) Teeth Intact, Dental Advisory Given   Pulmonary Current Smoker and Patient abstained from smoking.   Pulmonary exam normal breath sounds clear to auscultation       Cardiovascular negative cardio ROS Normal cardiovascular exam Rhythm:Regular Rate:Normal     Neuro/Psych negative neurological ROS  negative psych ROS   GI/Hepatic negative GI ROS, Neg liver ROS,,,  Endo/Other  negative endocrine ROS    Renal/GU negative Renal ROS  negative genitourinary   Musculoskeletal negative musculoskeletal ROS (+)    Abdominal   Peds  Hematology negative hematology ROS (+)   Anesthesia Other Findings   Reproductive/Obstetrics                             Anesthesia Physical Anesthesia Plan  ASA: 2  Anesthesia Plan: General and Regional   Post-op Pain Management: Regional block* and Tylenol PO (pre-op)*   Induction: Intravenous  PONV Risk Score and Plan: 2 and Ondansetron, Dexamethasone and Midazolam  Airway Management Planned: LMA  Additional Equipment:   Intra-op Plan:   Post-operative Plan: Extubation in OR  Informed Consent: I have reviewed the patients History and Physical, chart, labs and discussed the procedure including the risks, benefits and alternatives for the proposed anesthesia with the patient or authorized representative who has indicated his/her understanding and acceptance.     Dental advisory given  Plan Discussed with: CRNA  Anesthesia Plan Comments:        Anesthesia Quick Evaluation

## 2023-10-18 NOTE — Progress Notes (Signed)
Assisted Dr. Woodrum with left, adductor canal, popliteal, ultrasound guided block. Side rails up, monitors on throughout procedure. See vital signs in flow sheet. Tolerated Procedure well. 

## 2023-10-18 NOTE — Anesthesia Procedure Notes (Signed)
Procedure Name: LMA Insertion Date/Time: 10/18/2023 12:40 PM  Performed by: Cleda Clarks, CRNAPre-anesthesia Checklist: Patient identified, Emergency Drugs available, Suction available and Patient being monitored Patient Re-evaluated:Patient Re-evaluated prior to induction Oxygen Delivery Method: Circle system utilized Preoxygenation: Pre-oxygenation with 100% oxygen Induction Type: IV induction Ventilation: Mask ventilation without difficulty LMA: LMA inserted LMA Size: 4.0 Number of attempts: 1 Placement Confirmation: positive ETCO2 Tube secured with: Tape Dental Injury: Teeth and Oropharynx as per pre-operative assessment

## 2023-10-18 NOTE — Interval H&P Note (Signed)
History and Physical Interval Note:  10/18/2023 12:21 PM  Kim Bauer  has presented today for surgery, with the diagnosis of LEFT ANKLE FRACTURE.  The various methods of treatment have been discussed with the patient and family. After consideration of risks, benefits and other options for treatment, the patient has consented to  Procedure(s): OPEN REDUCTION INTERNAL FIXATION (ORIF) ANKLE FRACTURE (Left) as a surgical intervention.  The patient's history has been reviewed, patient examined, no change in status, stable for surgery.  I have reviewed the patient's chart and labs.  Questions were answered to the patient's satisfaction.     Sheral Apley

## 2023-10-18 NOTE — Anesthesia Procedure Notes (Signed)
Anesthesia Regional Block: Adductor canal block   Pre-Anesthetic Checklist: , timeout performed,  Correct Patient, Correct Site, Correct Laterality,  Correct Procedure, Correct Position, site marked,  Risks and benefits discussed,  Pre-op evaluation,  At surgeon's request and post-op pain management  Laterality: Left  Prep: Maximum Sterile Barrier Precautions used, chloraprep       Needles:  Injection technique: Single-shot  Needle Type: Echogenic Stimulator Needle     Needle Length: 9cm  Needle Gauge: 21     Additional Needles:   Procedures:,,,, ultrasound used (permanent image in chart),,    Narrative:  Start time: 10/18/2023 11:36 AM End time: 10/18/2023 11:40 AM Injection made incrementally with aspirations every 5 mL. Anesthesiologist: Elmer Picker, MD

## 2023-10-18 NOTE — Anesthesia Procedure Notes (Signed)
Anesthesia Regional Block: Popliteal block   Pre-Anesthetic Checklist: , timeout performed,  Correct Patient, Correct Site, Correct Laterality,  Correct Procedure, Correct Position, site marked,  Risks and benefits discussed,  Pre-op evaluation,  At surgeon's request and post-op pain management  Laterality: Left  Prep: Maximum Sterile Barrier Precautions used, chloraprep       Needles:  Injection technique: Single-shot  Needle Type: Echogenic Stimulator Needle     Needle Length: 9cm  Needle Gauge: 21     Additional Needles:   Procedures:,,,, ultrasound used (permanent image in chart),,    Narrative:  Start time: 10/18/2023 11:32 AM End time: 10/18/2023 11:36 AM Injection made incrementally with aspirations every 5 mL. Anesthesiologist: Elmer Picker, MD

## 2023-10-18 NOTE — Op Note (Signed)
10/18/2023  1:15 PM  PATIENT:  Kim Bauer    PRE-OPERATIVE DIAGNOSIS:  LEFT ANKLE FRACTURE  POST-OPERATIVE DIAGNOSIS:  Same  PROCEDURE:  OPEN REDUCTION INTERNAL FIXATION (ORIF) ANKLE FRACTURE  SURGEON:  Sheral Apley, MD  ASSISTANT: Levester Fresh, PA-C, he was present and scrubbed throughout the case, critical for completion in a timely fashion, and for retraction, instrumentation, and closure.   ANESTHESIA:   gen   PREOPERATIVE INDICATIONS:  [name] is a  37 y.o. female with a diagnosis of LEFT ANKLE FRACTURE who failed conservative measures and elected for surgical management.    The risks benefits and alternatives were discussed with the patient preoperatively including but not limited to the risks of infection, bleeding, nerve injury, cardiopulmonary complications, the need for revision surgery, among others, and the patient was willing to proceed.  OPERATIVE IMPLANTS: stryker plate, arthrex tightrope  OPERATIVE FINDINGS: Unstable ankle fracture. Stable syndesmosis post op  BLOOD LOSS: min  COMPLICATIONS: none  TOURNIQUET TIME:  OPERATIVE PROCEDURE:  Patient was identified in the preoperative holding area and site was marked by me He was transported to the operating theater and placed on the table in supine position taking care to pad all bony prominences. After a preincinduction time out anesthesia was induced. The left lower extremity was prepped and draped in normal sterile fashion and a pre-incision timeout was performed. Kim Bauer received ancef for preoperative antibiotics.   I made a lateral incision of roughly 7 cm dissection was carried down sharply to the distal fibula and then spreading dissection was used proximally to protect the superficial peroneal nerve. I sharply incised the periosteum and took care to protect the peroneal tendons. I then debrided the fracture site and performed a reduction maneuver which was held in place with a  clamp.   I placed a lag screw across the fracture  I then selected a 7-hole one third tubular plate and placed in a neutralization fashion care was taken distally so as not to penetrate the joint with the cancellus screws.  I then stressed the syndesmosis and it was unstable for syndesmotic fixation I performed a reduction maneuver with a and placed a tightrope  The wound was then thoroughly irrigated and closed using a 0 Vicryl and absorbable Monocryl sutures. He was placed in a short leg splint.   POST OPERATIVE PLAN: Non-weightbearing. DVT prophylaxis will consist of mobilization and chemical px

## 2023-10-18 NOTE — Discharge Instructions (Addendum)
POST-OPERATIVE OPIOID TAPER INSTRUCTIONS: It is important to wean off of your opioid medication as soon as possible. If you do not need pain medication after your surgery it is ok to stop day one. Opioids include: Codeine, Hydrocodone(Norco, Vicodin), Oxycodone(Percocet, oxycontin) and hydromorphone amongst others.  Long term and even short term use of opiods can cause: Increased pain response Dependence Constipation Depression Respiratory depression And more.  Withdrawal symptoms can include Flu like symptoms Nausea, vomiting And more Techniques to manage these symptoms Hydrate well Eat regular healthy meals Stay active Use relaxation techniques(deep breathing, meditating, yoga) Do Not substitute Alcohol to help with tapering If you have been on opioids for less than two weeks and do not have pain than it is ok to stop all together.  Plan to wean off of opioids This plan should start within one week post op of your joint replacement. Maintain the same interval or time between taking each dose and first decrease the dose.  Cut the total daily intake of opioids by one tablet each day Next start to increase the time between doses. The last dose that should be eliminated is the evening dose.    Post Anesthesia Home Care Instructions  Activity: Get plenty of rest for the remainder of the day. A responsible individual must stay with you for 24 hours following the procedure.  For the next 24 hours, DO NOT: -Drive a car -Advertising copywriter -Drink alcoholic beverages -Take any medication unless instructed by your physician -Make any legal decisions or sign important papers.  Meals: Start with liquid foods such as gelatin or soup. Progress to regular foods as tolerated. Avoid greasy, spicy, heavy foods. If nausea and/or vomiting occur, drink only clear liquids until the nausea and/or vomiting subsides. Call your physician if vomiting continues.  Special Instructions/Symptoms: Your  throat may feel dry or sore from the anesthesia or the breathing tube placed in your throat during surgery. If this causes discomfort, gargle with warm salt water. The discomfort should disappear within 24 hours.  If you had a scopolamine patch placed behind your ear for the management of post- operative nausea and/or vomiting:  1. The medication in the patch is effective for 72 hours, after which it should be removed.  Wrap patch in a tissue and discard in the trash. Wash hands thoroughly with soap and water. 2. You may remove the patch earlier than 72 hours if you experience unpleasant side effects which may include dry mouth, dizziness or visual disturbances. 3. Avoid touching the patch. Wash your hands with soap and water after contact with the patch.   Regional Anesthesia Blocks  1. You may not be able to move or feel the "blocked" extremity after a regional anesthetic block. This may last may last from 3-48 hours after placement, but it will go away. The length of time depends on the medication injected and your individual response to the medication. As the nerves start to wake up, you may experience tingling as the movement and feeling returns to your extremity. If the numbness and inability to move your extremity has not gone away after 48 hours, please call your surgeon.   2. The extremity that is blocked will need to be protected until the numbness is gone and the strength has returned. Because you cannot feel it, you will need to take extra care to avoid injury. Because it may be weak, you may have difficulty moving it or using it. You may not know what position it is in  without looking at it while the block is in effect.  3. For blocks in the legs and feet, returning to weight bearing and walking needs to be done carefully. You will need to wait until the numbness is entirely gone and the strength has returned. You should be able to move your leg and foot normally before you try and bear  weight or walk. You will need someone to be with you when you first try to ensure you do not fall and possibly risk injury.  4. Bruising and tenderness at the needle site are common side effects and will resolve in a few days.  5. Persistent numbness or new problems with movement should be communicated to the surgeon or the [facility] [phone] [facility] [phone]).  No Tylenol until after 4:30pm today, if needed.

## 2023-10-21 ENCOUNTER — Encounter (HOSPITAL_BASED_OUTPATIENT_CLINIC_OR_DEPARTMENT_OTHER): Payer: Self-pay | Admitting: Orthopedic Surgery

## 2023-10-22 NOTE — Anesthesia Postprocedure Evaluation (Signed)
Anesthesia Post Note  Patient: Crystale Giannattasio  Procedure(s) Performed: OPEN REDUCTION INTERNAL FIXATION (ORIF) ANKLE FRACTURE (Left: Ankle)     Patient location during evaluation: PACU Anesthesia Type: Regional and General Level of consciousness: awake and alert Pain management: pain level controlled Vital Signs Assessment: post-procedure vital signs reviewed and stable Respiratory status: spontaneous breathing, nonlabored ventilation, respiratory function stable and patient connected to nasal cannula oxygen Cardiovascular status: blood pressure returned to baseline and stable Postop Assessment: no apparent nausea or vomiting Anesthetic complications: no  No notable events documented.  Last Vitals:  Vitals:   10/18/23 1446 10/18/23 1455  BP: 118/76 128/88  Pulse: 73 83  Resp: 11 16  Temp:  (!) 36.2 C  SpO2: 99% 99%    Last Pain:  Vitals:   10/18/23 1021  TempSrc: Temporal                 Shatonya Passon L Candace Ramus

## 2023-10-25 DIAGNOSIS — S8262XA Displaced fracture of lateral malleolus of left fibula, initial encounter for closed fracture: Secondary | ICD-10-CM | POA: Diagnosis not present

## 2023-10-31 DIAGNOSIS — M25672 Stiffness of left ankle, not elsewhere classified: Secondary | ICD-10-CM | POA: Diagnosis not present

## 2023-10-31 DIAGNOSIS — S8262XD Displaced fracture of lateral malleolus of left fibula, subsequent encounter for closed fracture with routine healing: Secondary | ICD-10-CM | POA: Diagnosis not present

## 2023-10-31 DIAGNOSIS — M6281 Muscle weakness (generalized): Secondary | ICD-10-CM | POA: Diagnosis not present

## 2023-10-31 DIAGNOSIS — R262 Difficulty in walking, not elsewhere classified: Secondary | ICD-10-CM | POA: Diagnosis not present

## 2023-11-06 DIAGNOSIS — M25672 Stiffness of left ankle, not elsewhere classified: Secondary | ICD-10-CM | POA: Diagnosis not present

## 2023-11-06 DIAGNOSIS — S8262XD Displaced fracture of lateral malleolus of left fibula, subsequent encounter for closed fracture with routine healing: Secondary | ICD-10-CM | POA: Diagnosis not present

## 2023-11-06 DIAGNOSIS — M6281 Muscle weakness (generalized): Secondary | ICD-10-CM | POA: Diagnosis not present

## 2023-11-06 DIAGNOSIS — R262 Difficulty in walking, not elsewhere classified: Secondary | ICD-10-CM | POA: Diagnosis not present

## 2023-11-11 DIAGNOSIS — R262 Difficulty in walking, not elsewhere classified: Secondary | ICD-10-CM | POA: Diagnosis not present

## 2023-11-11 DIAGNOSIS — M25672 Stiffness of left ankle, not elsewhere classified: Secondary | ICD-10-CM | POA: Diagnosis not present

## 2023-11-11 DIAGNOSIS — S8262XD Displaced fracture of lateral malleolus of left fibula, subsequent encounter for closed fracture with routine healing: Secondary | ICD-10-CM | POA: Diagnosis not present

## 2023-11-11 DIAGNOSIS — M6281 Muscle weakness (generalized): Secondary | ICD-10-CM | POA: Diagnosis not present

## 2023-11-20 DIAGNOSIS — R262 Difficulty in walking, not elsewhere classified: Secondary | ICD-10-CM | POA: Diagnosis not present

## 2023-11-20 DIAGNOSIS — M6281 Muscle weakness (generalized): Secondary | ICD-10-CM | POA: Diagnosis not present

## 2023-11-20 DIAGNOSIS — S8262XD Displaced fracture of lateral malleolus of left fibula, subsequent encounter for closed fracture with routine healing: Secondary | ICD-10-CM | POA: Diagnosis not present

## 2023-11-20 DIAGNOSIS — M25672 Stiffness of left ankle, not elsewhere classified: Secondary | ICD-10-CM | POA: Diagnosis not present

## 2023-12-06 DIAGNOSIS — S8262XD Displaced fracture of lateral malleolus of left fibula, subsequent encounter for closed fracture with routine healing: Secondary | ICD-10-CM | POA: Diagnosis not present

## 2023-12-10 DIAGNOSIS — R262 Difficulty in walking, not elsewhere classified: Secondary | ICD-10-CM | POA: Diagnosis not present

## 2023-12-10 DIAGNOSIS — S8262XD Displaced fracture of lateral malleolus of left fibula, subsequent encounter for closed fracture with routine healing: Secondary | ICD-10-CM | POA: Diagnosis not present

## 2023-12-10 DIAGNOSIS — M6281 Muscle weakness (generalized): Secondary | ICD-10-CM | POA: Diagnosis not present

## 2023-12-10 DIAGNOSIS — M25672 Stiffness of left ankle, not elsewhere classified: Secondary | ICD-10-CM | POA: Diagnosis not present

## 2024-01-20 ENCOUNTER — Ambulatory Visit (HOSPITAL_BASED_OUTPATIENT_CLINIC_OR_DEPARTMENT_OTHER): Payer: BC Managed Care – PPO | Admitting: Family Medicine

## 2024-02-03 ENCOUNTER — Ambulatory Visit (INDEPENDENT_AMBULATORY_CARE_PROVIDER_SITE_OTHER): Payer: BC Managed Care – PPO | Admitting: Certified Nurse Midwife

## 2024-02-03 ENCOUNTER — Encounter (HOSPITAL_BASED_OUTPATIENT_CLINIC_OR_DEPARTMENT_OTHER): Payer: Self-pay | Admitting: Certified Nurse Midwife

## 2024-02-03 ENCOUNTER — Other Ambulatory Visit (HOSPITAL_BASED_OUTPATIENT_CLINIC_OR_DEPARTMENT_OTHER): Payer: Self-pay

## 2024-02-03 ENCOUNTER — Other Ambulatory Visit (HOSPITAL_COMMUNITY)
Admission: RE | Admit: 2024-02-03 | Discharge: 2024-02-03 | Disposition: A | Discharge status: Home / Self Care | Payer: BC Managed Care – PPO | Source: Ambulatory Visit | Attending: Certified Nurse Midwife | Admitting: Certified Nurse Midwife

## 2024-02-03 VITALS — BP 133/79 | HR 91 | Ht 63.5 in | Wt 219.6 lb

## 2024-02-03 DIAGNOSIS — Z01419 Encounter for gynecological examination (general) (routine) without abnormal findings: Secondary | ICD-10-CM | POA: Diagnosis not present

## 2024-02-03 DIAGNOSIS — N898 Other specified noninflammatory disorders of vagina: Secondary | ICD-10-CM | POA: Insufficient documentation

## 2024-02-03 DIAGNOSIS — Z8742 Personal history of other diseases of the female genital tract: Secondary | ICD-10-CM | POA: Diagnosis not present

## 2024-02-03 DIAGNOSIS — Z124 Encounter for screening for malignant neoplasm of cervix: Secondary | ICD-10-CM | POA: Diagnosis not present

## 2024-02-03 HISTORY — DX: Other specified noninflammatory disorders of vagina: N89.8

## 2024-02-03 MED ORDER — FLUCONAZOLE 150 MG PO TABS
150.0000 mg | ORAL_TABLET | ORAL | 2 refills | Status: DC | PRN
  Filled 2024-02-03: qty 2, 4d supply, fill #0

## 2024-02-03 MED ORDER — METRONIDAZOLE 500 MG PO TABS
500.0000 mg | ORAL_TABLET | Freq: Two times a day (BID) | ORAL | 3 refills | Status: DC
Start: 2024-02-03 — End: 2024-04-28
  Filled 2024-02-03: qty 14, 7d supply, fill #0
  Filled 2024-03-16 (×3): qty 14, 7d supply, fill #1
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
## 2024-02-04 ENCOUNTER — Encounter (HOSPITAL_BASED_OUTPATIENT_CLINIC_OR_DEPARTMENT_OTHER): Payer: Self-pay | Admitting: Family Medicine

## 2024-02-04 ENCOUNTER — Ambulatory Visit (HOSPITAL_BASED_OUTPATIENT_CLINIC_OR_DEPARTMENT_OTHER): Payer: Medicaid Other | Admitting: Family Medicine

## 2024-02-04 VITALS — BP 133/79 | HR 80 | Ht 63.0 in | Wt 218.8 lb

## 2024-02-04 DIAGNOSIS — R3589 Other polyuria: Secondary | ICD-10-CM | POA: Diagnosis not present

## 2024-02-04 DIAGNOSIS — E669 Obesity, unspecified: Secondary | ICD-10-CM | POA: Diagnosis not present

## 2024-02-04 DIAGNOSIS — Z716 Tobacco abuse counseling: Secondary | ICD-10-CM | POA: Insufficient documentation

## 2024-02-04 DIAGNOSIS — Z13220 Encounter for screening for lipoid disorders: Secondary | ICD-10-CM | POA: Diagnosis not present

## 2024-02-04 HISTORY — DX: Other polyuria: R35.89

## 2024-02-04 LAB — CERVICOVAGINAL ANCILLARY ONLY
Bacterial Vaginitis (gardnerella): POSITIVE — AB
Candida Glabrata: NEGATIVE
Candida Vaginitis: NEGATIVE
Chlamydia: NEGATIVE
Comment: NEGATIVE
Comment: NEGATIVE
Comment: NEGATIVE
Comment: NEGATIVE
Comment: NEGATIVE
Comment: NORMAL
Neisseria Gonorrhea: NEGATIVE
Trichomonas: NEGATIVE

## 2024-02-04 LAB — POCT URINALYSIS DIP (CLINITEK)
Bilirubin, UA: NEGATIVE
Blood, UA: NEGATIVE
Glucose, UA: NEGATIVE mg/dL
Ketones, POC UA: NEGATIVE mg/dL
Leukocytes, UA: NEGATIVE
Nitrite, UA: NEGATIVE
Spec Grav, UA: 1.025 (ref 1.010–1.025)
Urobilinogen, UA: 1 U/dL
pH, UA: 7 (ref 5.0–8.0)

## 2024-02-04 MED ORDER — NICOTINE 21 MG/24HR TD PT24: 21.0000 mg | MEDICATED_PATCH | Freq: Every day | TRANSDERMAL | 1 refills | Status: DC

## 2024-02-04 MED ORDER — BUPROPION HCL ER (SR) 150 MG PO TB12: ORAL_TABLET | ORAL | 0 refills | Status: DC
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
## 2024-02-04 NOTE — Patient Instructions (Signed)
 Smoking cessation (patients should completely stop smoking upon initiation of therapy): Note: Extended duration therapy (>12 weeks) of nicotine cessation products is often used to ensure patient does not return to smoking (Ref).   Transdermal patch: Topical: Note: Adjustment may be required during initial treatment (move to higher dose if experiencing withdrawal symptoms; lower dose if side effects are experienced). Patients smoking >10 cigarettes/day: Begin with step 1 (21 mg/day) for 6 weeks, followed by step 2 (14 mg/day) for 2 weeks; finish with step 3 (7 mg/day) for 2 weeks.

## 2024-02-05 ENCOUNTER — Encounter (HOSPITAL_BASED_OUTPATIENT_CLINIC_OR_DEPARTMENT_OTHER): Payer: Self-pay | Admitting: Certified Nurse Midwife

## 2024-02-05 LAB — COMPREHENSIVE METABOLIC PANEL
ALT: 13 [IU]/L (ref 0–32)
AST: 15 [IU]/L (ref 0–40)
Albumin: 4.3 g/dL (ref 3.9–4.9)
Alkaline Phosphatase: 89 [IU]/L (ref 44–121)
BUN/Creatinine Ratio: 12 (ref 9–23)
BUN: 8 mg/dL (ref 6–20)
Bilirubin Total: 0.3 mg/dL (ref 0.0–1.2)
CO2: 16 mmol/L — ABNORMAL LOW (ref 20–29)
Calcium: 9.1 mg/dL (ref 8.7–10.2)
Chloride: 106 mmol/L (ref 96–106)
Creatinine, Ser: 0.68 mg/dL (ref 0.57–1.00)
Globulin, Total: 3.1 g/dL (ref 1.5–4.5)
Glucose: 89 mg/dL (ref 70–99)
Potassium: 4.6 mmol/L (ref 3.5–5.2)
Sodium: 143 mmol/L (ref 134–144)
Total Protein: 7.4 g/dL (ref 6.0–8.5)
eGFR: 114 mL/min/{1.73_m2} (ref >59)

## 2024-02-05 LAB — LIPID PANEL
Chol/HDL Ratio: 4.3 {ratio} (ref 0.0–4.4)
Cholesterol, Total: 183 mg/dL (ref 100–199)
HDL: 43 mg/dL (ref >39)
LDL Chol Calc (NIH): 120 mg/dL — ABNORMAL HIGH (ref 0–99)
Triglycerides: 108 mg/dL (ref 0–149)
VLDL Cholesterol Cal: 20 mg/dL (ref 5–40)

## 2024-02-05 LAB — HEMOGLOBIN A1C
Est. average glucose Bld gHb Est-mCnc: 157 mg/dL
Hgb A1c MFr Bld: 7.1 % — ABNORMAL HIGH (ref 4.8–5.6)

## 2024-02-06 ENCOUNTER — Other Ambulatory Visit (HOSPITAL_BASED_OUTPATIENT_CLINIC_OR_DEPARTMENT_OTHER): Payer: Self-pay | Admitting: Family Medicine

## 2024-02-06 ENCOUNTER — Encounter (HOSPITAL_BASED_OUTPATIENT_CLINIC_OR_DEPARTMENT_OTHER): Payer: Self-pay | Admitting: Family Medicine

## 2024-02-06 DIAGNOSIS — E119 Type 2 diabetes mellitus without complications: Secondary | ICD-10-CM | POA: Insufficient documentation

## 2024-02-06 LAB — CYTOLOGY - PAP
Chlamydia: NEGATIVE
Comment: NEGATIVE
Comment: NEGATIVE
Comment: NEGATIVE
Comment: NEGATIVE
Comment: NEGATIVE
Comment: NORMAL
Diagnosis: NEGATIVE
HPV 16: NEGATIVE
HPV 18 / 45: NEGATIVE
High risk HPV: POSITIVE — AB
Neisseria Gonorrhea: NEGATIVE
Trichomonas: NEGATIVE

## 2024-02-06 MED ORDER — METFORMIN HCL ER 500 MG PO TB24: 500.0000 mg | ORAL_TABLET | Freq: Two times a day (BID) | ORAL | 3 refills | Status: DC

## 2024-02-06 NOTE — Progress Notes (Signed)
 Discussed results of cholesterol, A1c and CMP with patient over the phone.  She verbalized understanding of new diabetes diagnosis and we discussed in depth needed dietary changes, exercise and lifestyle management for improvement and decrease risk of progression of diabetes.  We will start metformin 500 mg XR twice daily and follow-up as scheduled

## 2024-03-16 ENCOUNTER — Other Ambulatory Visit (HOSPITAL_BASED_OUTPATIENT_CLINIC_OR_DEPARTMENT_OTHER): Payer: Self-pay | Admitting: Certified Nurse Midwife

## 2024-03-16 ENCOUNTER — Ambulatory Visit (HOSPITAL_BASED_OUTPATIENT_CLINIC_OR_DEPARTMENT_OTHER): Payer: BC Managed Care – PPO | Admitting: Family Medicine

## 2024-03-16 ENCOUNTER — Other Ambulatory Visit (HOSPITAL_BASED_OUTPATIENT_CLINIC_OR_DEPARTMENT_OTHER): Payer: Self-pay

## 2024-03-16 ENCOUNTER — Encounter (HOSPITAL_BASED_OUTPATIENT_CLINIC_OR_DEPARTMENT_OTHER): Payer: Self-pay | Admitting: Family Medicine

## 2024-03-16 VITALS — BP 121/71 | HR 75 | Ht 63.0 in | Wt 218.2 lb

## 2024-03-16 DIAGNOSIS — Z0000 Encounter for general adult medical examination without abnormal findings: Secondary | ICD-10-CM

## 2024-03-16 DIAGNOSIS — L2082 Flexural eczema: Secondary | ICD-10-CM | POA: Insufficient documentation

## 2024-03-16 DIAGNOSIS — E119 Type 2 diabetes mellitus without complications: Secondary | ICD-10-CM | POA: Diagnosis not present

## 2024-03-16 LAB — POCT UA - MICROALBUMIN
Albumin/Creatinine Ratio, Urine, POC: <30
Creatinine, POC: 300 mg/dL
Microalbumin Ur, POC: 10 mg/L

## 2024-03-16 MED ORDER — TIRZEPATIDE 2.5 MG/0.5ML ~~LOC~~ SOAJ: 2.5000 mg | SUBCUTANEOUS | 1 refills | Status: DC

## 2024-03-16 MED ORDER — METRONIDAZOLE 500 MG PO TABS: 500.0000 mg | ORAL_TABLET | Freq: Two times a day (BID) | ORAL | 3 refills | Status: DC

## 2024-03-16 MED ORDER — TRIAMCINOLONE ACETONIDE 0.1 % EX OINT: 1.0000 | TOPICAL_OINTMENT | Freq: Two times a day (BID) | CUTANEOUS | 6 refills | Status: DC

## 2024-03-16 NOTE — Patient Instructions (Addendum)
    [email or web address]  [facility] Locations: [facility] [address] [place], [place] [address] [phone]   [facility] [address] [place], [place] [address] [phone]  [facility] [address] [place], [place] [address] [phone]  [facility] [address] [place], [place] [address] [phone]  [facility] [address], [place], [place] [address] [phone]    [facility] Location: [facility] [address] [place], [place] [address] Details: This 45-chair M[facility] Clinic is open to all patients; no pre-registration is required. Doors open at 6:00 a.m.    Ophthalmology Offices   [facility]  [address]  [place], [place] [address] [phone]  [facility] [address] [place], [place] [address] Phone: [phone]  [facility] [address] [place], [place] [address] Phone: [phone]  [facility]  Optometrist [address]  [phone]  [facility] [address]  [phone]
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
## 2024-04-28 ENCOUNTER — Encounter (HOSPITAL_COMMUNITY): Payer: Self-pay

## 2024-04-28 ENCOUNTER — Ambulatory Visit (HOSPITAL_COMMUNITY)
Admission: EM | Admit: 2024-04-28 | Discharge: 2024-04-28 | Disposition: A | Discharge status: Home / Self Care | Attending: Family Medicine | Admitting: Family Medicine

## 2024-04-28 DIAGNOSIS — L03011 Cellulitis of right finger: Secondary | ICD-10-CM | POA: Diagnosis not present

## 2024-04-28 HISTORY — DX: Type 2 diabetes mellitus without complications: E11.9

## 2024-04-28 MED ORDER — DOXYCYCLINE HYCLATE 100 MG PO CAPS: 100.0000 mg | ORAL_CAPSULE | Freq: Two times a day (BID) | ORAL | 0 refills | Status: DC

## 2024-04-28 NOTE — ED Triage Notes (Signed)
 Patient reports that she has had swelling at the nail bed and pain to the right ring finger x 3 days. Patient reports that she does not know if she injured it or not.  Patient states she has been taking Ibuprofen  for pain.
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
## 2024-05-12 ENCOUNTER — Other Ambulatory Visit (HOSPITAL_BASED_OUTPATIENT_CLINIC_OR_DEPARTMENT_OTHER): Payer: Self-pay | Admitting: Family Medicine

## 2024-05-12 DIAGNOSIS — E119 Type 2 diabetes mellitus without complications: Secondary | ICD-10-CM

## 2024-06-05 ENCOUNTER — Other Ambulatory Visit (HOSPITAL_BASED_OUTPATIENT_CLINIC_OR_DEPARTMENT_OTHER): Payer: Self-pay | Admitting: Family Medicine

## 2024-06-05 DIAGNOSIS — E119 Type 2 diabetes mellitus without complications: Secondary | ICD-10-CM

## 2024-06-16 ENCOUNTER — Encounter (HOSPITAL_BASED_OUTPATIENT_CLINIC_OR_DEPARTMENT_OTHER): Payer: Self-pay | Admitting: Family Medicine

## 2024-06-16 ENCOUNTER — Ambulatory Visit (HOSPITAL_BASED_OUTPATIENT_CLINIC_OR_DEPARTMENT_OTHER): Admitting: Family Medicine

## 2024-06-16 VITALS — BP 124/81 | HR 92 | Ht 63.0 in | Wt 218.5 lb

## 2024-06-16 DIAGNOSIS — E119 Type 2 diabetes mellitus without complications: Secondary | ICD-10-CM | POA: Diagnosis not present

## 2024-06-16 LAB — POCT GLYCOSYLATED HEMOGLOBIN (HGB A1C)
HbA1c POC (<> result, manual entry): 6.6 % (ref 4.0–5.6)
HbA1c, POC (controlled diabetic range): 6.6 % (ref 0.0–7.0)
Hemoglobin A1C: 6.6 % — AB (ref 4.0–5.6)

## 2024-06-16 MED ORDER — TIRZEPATIDE 5 MG/0.5ML ~~LOC~~ SOAJ: 5.0000 mg | SUBCUTANEOUS | 3 refills | Status: DC
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
## 2024-06-16 NOTE — Addendum Note (Signed)
 Addended by: RONNETTE DAMIEN SQUIBB on: 06/16/2024 12:50 PM   Modules accepted: Orders
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
## 2024-06-16 NOTE — Patient Instructions (Signed)
 Ophthalmology Offices   [facility]  [address]  [place], [place] [address] [phone]  [facility] Ophthalmology [address] [place], [place] [address] Phone: [phone]  [facility] [address] [place], [place] [address] Phone: [phone]  [facility]  Optometrist [address]  [phone]  [facility] Optometrist [address]  [phone]

## 2024-06-23 ENCOUNTER — Other Ambulatory Visit (HOSPITAL_BASED_OUTPATIENT_CLINIC_OR_DEPARTMENT_OTHER): Payer: Self-pay | Admitting: Family Medicine

## 2024-06-23 ENCOUNTER — Telehealth (HOSPITAL_BASED_OUTPATIENT_CLINIC_OR_DEPARTMENT_OTHER): Payer: Self-pay | Admitting: *Deleted

## 2024-06-23 DIAGNOSIS — E119 Type 2 diabetes mellitus without complications: Secondary | ICD-10-CM

## 2024-06-23 MED ORDER — TIRZEPATIDE 5 MG/0.5ML ~~LOC~~ SOAJ: 5.0000 mg | SUBCUTANEOUS | 3 refills | Status: AC

## 2024-06-23 NOTE — Telephone Encounter (Signed)
 Copied from CRM [id number]. Topic: Clinical - Medication Question >> Jun 23, 2024 10:46 AM Marissa P wrote: Reason for CRM: Patient called stating that there was suppose to be another prescription sent in for the mounjaro  a higher dosage please advise

## 2024-06-23 NOTE — Telephone Encounter (Signed)
**Note De-identified  [place] Obfuscation** Please advise 

## 2024-06-29 LAB — OPHTHALMOLOGY REPORT-SCANNED

## 2024-08-31 ENCOUNTER — Emergency Department (HOSPITAL_COMMUNITY)

## 2024-08-31 ENCOUNTER — Encounter (HOSPITAL_COMMUNITY): Payer: Self-pay

## 2024-08-31 ENCOUNTER — Emergency Department (HOSPITAL_COMMUNITY)
Admission: EM | Admit: 2024-08-31 | Discharge: 2024-08-31 | Disposition: A | Discharge status: Home / Self Care | Attending: Emergency Medicine | Admitting: Emergency Medicine

## 2024-08-31 ENCOUNTER — Other Ambulatory Visit: Payer: Self-pay

## 2024-08-31 DIAGNOSIS — M79671 Pain in right foot: Secondary | ICD-10-CM | POA: Insufficient documentation

## 2024-08-31 MED ORDER — KETOROLAC TROMETHAMINE 15 MG/ML IJ SOLN
15.0000 mg | Freq: Once | INTRAMUSCULAR | Status: AC
  Administered 2024-08-31: 15 mg via INTRAMUSCULAR
  Filled 2024-08-31: qty 1

## 2024-08-31 MED ORDER — ACETAMINOPHEN 500 MG PO TABS
1000.0000 mg | ORAL_TABLET | Freq: Once | ORAL | Status: AC
  Administered 2024-08-31: 1000 mg via ORAL
  Filled 2024-08-31: qty 2

## 2024-08-31 NOTE — Discharge Instructions (Addendum)
 Your x-ray looks good.  I have given you a shoe to try and help keep your foot supported along with crutches to try and keep your weight off of it. Wear the shoe as long as you like, or until you feel comfortable without it.  Please follow-up with your family doctor in the office.  Take 4 over the counter ibuprofen  tablets 3 times a day or 2 over-the-counter naproxen  tablets twice a day for pain. Also take tylenol  1000mg (2 extra strength) four times a day.

## 2024-08-31 NOTE — ED Notes (Signed)
 Discharge instructions, medications, and follow up care reviewed with and provided to pt. Pt denies any further questions, and has verbalized understanding.  Pt ambulatory out with use of crutches.

## 2024-08-31 NOTE — ED Triage Notes (Signed)
 Pt ambulatory to triage C/O R foot pain that began while she was playing kickball yesterday.

## 2024-08-31 NOTE — ED Provider Notes (Signed)
 [place] EMERGENCY DEPARTMENT AT [facility] Provider Note   CSN: [id number] Arrival date & time: 08/31/24  9162     Patient presents with: Foot Pain   Kim Bauer is a 38 y.o. female.   38 yo F with a chief complaint of right heel pain.  Patient states she was playing kickball yesterday and while she was running she noticed she was having some pain along the heel of the right foot.  She denies overt injury.  Denies injury elsewhere.  Has been able to walk but with pain.   Foot Pain       Prior to Admission medications   Medication Sig Start Date End Date Taking? Authorizing Provider  doxycycline  (VIBRAMYCIN ) 100 MG capsule Take 1 capsule (100 mg total) by mouth 2 (two) times daily. 04/28/24   Kim Bauer  tirzepatide  (MOUNJARO ) 5 MG/0.5ML Pen Inject 5 mg into the skin once a week. 06/23/24   Caudle, Kim Bauer  triamcinolone  ointment (KENALOG ) 0.1 % Apply 1 Application topically 2 (two) times daily. To affected areas 03/16/24   Knute Kim Bauer    Allergies: Patient has no known allergies.    Review of Systems  Updated Vital Signs BP 137/87 (BP Location: Left Arm)   Pulse 84   Temp 98.3 F (36.8 C) (Oral)   Resp 16   Ht 5' 3 (1.6 m)   Wt 97.5 kg   LMP 10/29/2016 (Approximate) Comment: neg preg test  SpO2 100%   BMI 38.09 kg/m   Physical Exam Vitals and nursing note reviewed.  Constitutional:      General: She is not in acute distress.    Appearance: She is well-developed. She is not diaphoretic.  HENT:     Head: Normocephalic and atraumatic.  Eyes:     Pupils: Pupils are equal, round, and reactive to light.  Cardiovascular:     Rate and Rhythm: Normal rate and regular rhythm.     Heart sounds: No murmur heard.    No friction rub. No gallop.  Pulmonary:     Effort: Pulmonary effort is normal.     Breath sounds: No wheezing or rales.  Abdominal:     General: There is no distension.     Palpations: Abdomen is soft.      Tenderness: There is no abdominal tenderness.  Musculoskeletal:        General: No tenderness.     Cervical back: Normal range of motion and neck supple.     Comments: Patient points to her heel as area of most discomfort.  No obvious pain along the arch.  No pain at the base of the fifth metatarsal or navicular.  Pulse motor and sensation are intact distally.  No pain at either malleolus at the ankle. No obvious erythema or fluctuance.  Skin:    General: Skin is warm and dry.  Neurological:     Mental Status: She is alert and oriented to person, place, and time.  Psychiatric:        Behavior: Behavior normal.     (all labs ordered are listed, but only abnormal results are displayed) Labs Reviewed - No data to display  EKG: None  Radiology: DG Foot Complete Right Result Date: 08/31/2024 CLINICAL DATA:  Right foot pain after playing kickball. EXAM: RIGHT FOOT COMPLETE - 3+ VIEW COMPARISON:  None Available. FINDINGS: There is no evidence of fracture or dislocation. There is no evidence of arthropathy or other focal bone abnormality. Soft  tissues are unremarkable. IMPRESSION: Negative. Electronically Signed   By: Kim Candle M.D.   On: 08/31/2024 09:34     Procedures   Medications Ordered in the ED  ketorolac  (TORADOL ) 15 MG/ML injection 15 mg (15 mg Intramuscular Given 08/31/24 0921)  acetaminophen  (TYLENOL ) tablet 1,000 mg (1,000 mg Oral Given 08/31/24 0920)                                    Medical Decision Making Amount and/or Complexity of Data Reviewed Radiology: ordered.  Risk OTC drugs. Prescription drug management.   38 yo F with a chief complaints of right heel pain.  Pain while running.  Will obtain plain film to assess for fracture.  No signs of infection clinically.  Treat supportively.  PCP follow-up.  Plain film of the right foot independently interpreted by me without fracture.  9:55 AM:  I have discussed the diagnosis/risks/treatment options with  the patient.  Evaluation and diagnostic testing in the emergency department does not suggest an emergent condition requiring admission or immediate intervention beyond what has been performed at this time.  They will follow up with PCP. We also discussed returning to the ED immediately if new or worsening sx occur. We discussed the sx which are most concerning (e.g., sudden worsening pain, fever, inability to tolerate by mouth) that necessitate immediate return. Medications administered to the patient during their visit and any new prescriptions provided to the patient are listed below.  Medications given during this visit Medications  ketorolac  (TORADOL ) 15 MG/ML injection 15 mg (15 mg Intramuscular Given 08/31/24 0921)  acetaminophen  (TYLENOL ) tablet 1,000 mg (1,000 mg Oral Given 08/31/24 0920)     The patient appears reasonably screen and/or stabilized for discharge and I doubt any other medical condition or other [facility] requiring further screening, evaluation, or treatment in the ED at this time prior to discharge.       Final diagnoses:  Foot pain, right    ED Discharge Orders     None          Kim Bauer, [place] 08/31/24 [phone]

## 2024-08-31 NOTE — ED Notes (Signed)
 Celene Balm at bedside.

## 2024-08-31 NOTE — Progress Notes (Signed)
 Orthopedic Tech Progress Note Patient Details:  Kim Bauer 08/25/1986 [id number] Applied post op shoe and gave pt crutches per order.  Ortho Devices Type of Ortho Device: Postop shoe/boot, Crutches Ortho Device/Splint Location: RLE Ortho Device/Splint Interventions: Ordered, Application, Adjustment   Post Interventions Patient Tolerated: Well Instructions Provided: Adjustment of device, Care of device, Poper ambulation with device  Morna Pink 08/31/2024, 10:34 AM

## 2024-09-01 ENCOUNTER — Ambulatory Visit: Admitting: Family Medicine

## 2024-09-03 ENCOUNTER — Ambulatory Visit (HOSPITAL_BASED_OUTPATIENT_CLINIC_OR_DEPARTMENT_OTHER): Admitting: Family Medicine

## 2024-09-03 ENCOUNTER — Encounter (HOSPITAL_BASED_OUTPATIENT_CLINIC_OR_DEPARTMENT_OTHER): Payer: Self-pay | Admitting: Family Medicine

## 2024-09-03 VITALS — BP 110/70 | HR 82 | Ht 63.0 in | Wt 203.0 lb

## 2024-09-03 DIAGNOSIS — M722 Plantar fascial fibromatosis: Secondary | ICD-10-CM | POA: Diagnosis not present

## 2024-09-03 MED ORDER — PREDNISONE 10 MG (21) PO TBPK: ORAL_TABLET | ORAL | 0 refills | Status: DC

## 2024-09-03 MED ORDER — MELOXICAM 15 MG PO TABS
15.0000 mg | ORAL_TABLET | Freq: Every day | ORAL | 0 refills | Status: DC
Start: 2024-09-03 — End: 2024-09-07

## 2024-09-03 NOTE — Patient Instructions (Signed)
 Take Meloxicam  with food for up to 14 days.  Do not take Aleve  or Ibuprofen  with Meloxicam .   Take steroid pack as directed.
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
## 2024-09-07 ENCOUNTER — Telehealth (HOSPITAL_BASED_OUTPATIENT_CLINIC_OR_DEPARTMENT_OTHER): Payer: Self-pay | Admitting: *Deleted

## 2024-09-07 DIAGNOSIS — M722 Plantar fascial fibromatosis: Secondary | ICD-10-CM

## 2024-09-07 MED ORDER — PREDNISONE 10 MG (21) PO TBPK: ORAL_TABLET | ORAL | 0 refills | Status: DC

## 2024-09-07 MED ORDER — MELOXICAM 15 MG PO TABS: 15.0000 mg | ORAL_TABLET | Freq: Every day | ORAL | 0 refills | Status: DC

## 2024-09-07 NOTE — Telephone Encounter (Signed)
 Resubmitted Rx for both meds to pt's preferred pharmacy. Called and spoke with pt letting her know this had been done and stated to her if they still didn't go through to the pharmacy to reach out to us  and we could then see what we needed to do. Pt verbalized understanding. Nothing further needed.

## 2024-09-07 NOTE — Telephone Encounter (Signed)
 Copied from CRM #[id number]. Topic: Clinical - Prescription Issue >> Sep 07, 2024  3:57 PM Everette C wrote: Reason for CRM: The patient has been directed by [facility] to contact their PCP and inquire about a previous submission of a prescription for meloxicam  (MOBIC ) 15 MG tablet [[id number]] and predniSONE  (STERAPRED UNI-PAK 21 TAB) 10 MG (21) TBPK tablet [[id number]]    The patient has been told that their prescriptions have not been successfully received

## 2024-09-17 ENCOUNTER — Ambulatory Visit (INDEPENDENT_AMBULATORY_CARE_PROVIDER_SITE_OTHER)

## 2024-09-17 ENCOUNTER — Encounter: Payer: Self-pay | Admitting: Podiatry

## 2024-09-17 ENCOUNTER — Ambulatory Visit (INDEPENDENT_AMBULATORY_CARE_PROVIDER_SITE_OTHER): Admitting: Podiatry

## 2024-09-17 DIAGNOSIS — M722 Plantar fascial fibromatosis: Secondary | ICD-10-CM

## 2024-09-17 MED ORDER — TRIAMCINOLONE ACETONIDE 10 MG/ML IJ SUSP
10.0000 mg | Freq: Once | INTRAMUSCULAR | Status: AC
  Administered 2024-09-17: 10 mg via INTRA_ARTICULAR

## 2024-09-17 MED ORDER — DICLOFENAC SODIUM 75 MG PO TBEC: 75.0000 mg | DELAYED_RELEASE_TABLET | Freq: Two times a day (BID) | ORAL | 2 refills | Status: DC

## 2024-09-18 NOTE — Progress Notes (Signed)
 Subjective:   Patient ID: Kim Bauer, female   DOB: 38 y.o.   MRN: [id number]   HPI Patient states she has developed a lot of pain in her heel right and left with now the right 1 being worse than the left 1 last time 2 years ago which was treated.  States it is very sore when she gets up very sore when she walks   ROS      Objective:  Physical Exam  Neurovascular status intact with exquisite inflammation in the right plantar fascia and also moderate to significant in the left plantar fascia at the insertion of the tendon into the calcaneus     Assessment:  Acute plantar fasciitis bilateral     Plan:  H&P reviewed condition today I did sterile prep and injected the plantar fascia at the insertion bilateral 3 mg Kenalog  5 mg Xylocaine  and applied sterile dressings bilateral.  Instructed on shoe gear modifications and stretching exercises and will be seen back as symptoms indicate also placing on diclofenac 75 mg twice daily  X-rays right indicate minimal spur no indication stress fracture or arthritis associated with condition

## 2024-11-30 ENCOUNTER — Other Ambulatory Visit: Payer: Self-pay | Admitting: Podiatry

## 2024-12-21 ENCOUNTER — Ambulatory Visit (HOSPITAL_BASED_OUTPATIENT_CLINIC_OR_DEPARTMENT_OTHER): Admitting: Family Medicine

## 2025-01-04 ENCOUNTER — Encounter (HOSPITAL_BASED_OUTPATIENT_CLINIC_OR_DEPARTMENT_OTHER): Payer: Self-pay | Admitting: Family Medicine

## 2025-01-04 ENCOUNTER — Ambulatory Visit (INDEPENDENT_AMBULATORY_CARE_PROVIDER_SITE_OTHER): Admitting: Family Medicine

## 2025-01-04 VITALS — BP 127/85 | HR 74 | Temp 98.4°F | Resp 18 | Ht 63.0 in | Wt 189.0 lb

## 2025-01-04 DIAGNOSIS — M722 Plantar fascial fibromatosis: Secondary | ICD-10-CM | POA: Diagnosis not present

## 2025-01-04 DIAGNOSIS — L2082 Flexural eczema: Secondary | ICD-10-CM | POA: Diagnosis not present

## 2025-01-04 DIAGNOSIS — E119 Type 2 diabetes mellitus without complications: Secondary | ICD-10-CM

## 2025-01-04 DIAGNOSIS — L84 Corns and callosities: Secondary | ICD-10-CM | POA: Insufficient documentation

## 2025-01-04 MED ORDER — TRIAMCINOLONE ACETONIDE 0.1 % EX OINT: 1.0000 | TOPICAL_OINTMENT | Freq: Two times a day (BID) | CUTANEOUS | 6 refills | Status: AC

## 2025-01-04 MED ORDER — MELOXICAM 15 MG PO TABS: 15.0000 mg | ORAL_TABLET | Freq: Every day | ORAL | 0 refills | Status: AC
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
## 2025-01-04 NOTE — Patient Instructions (Signed)
 Flexeril  Cream- apply after using skin shaving/grating device after soaking feet for at least 10-20 minutes. Follow this for at least 2 weeks or until callous is gone.

## 2025-01-05 LAB — COMPREHENSIVE METABOLIC PANEL WITH GFR
ALT: 27 IU/L (ref 0–32)
AST: 22 IU/L (ref 0–40)
Albumin: 4.2 g/dL (ref 3.9–4.9)
Alkaline Phosphatase: 65 IU/L (ref 41–116)
BUN/Creatinine Ratio: 14 (ref 9–23)
BUN: 9 mg/dL (ref 6–20)
Bilirubin Total: 0.3 mg/dL (ref 0.0–1.2)
CO2: 22 mmol/L (ref 20–29)
Calcium: 8.8 mg/dL (ref 8.7–10.2)
Chloride: 104 mmol/L (ref 96–106)
Creatinine, Ser: 0.64 mg/dL (ref 0.57–1.00)
Globulin, Total: 2.7 g/dL (ref 1.5–4.5)
Glucose: 86 mg/dL (ref 70–99)
Potassium: 4.2 mmol/L (ref 3.5–5.2)
Sodium: 141 mmol/L (ref 134–144)
Total Protein: 6.9 g/dL (ref 6.0–8.5)
eGFR: 115 mL/min/1.73

## 2025-01-05 LAB — HEMOGLOBIN A1C
Est. average glucose Bld gHb Est-mCnc: 120 mg/dL
Hgb A1c MFr Bld: 5.8 % — ABNORMAL HIGH (ref 4.8–5.6)

## 2025-01-07 ENCOUNTER — Ambulatory Visit (HOSPITAL_BASED_OUTPATIENT_CLINIC_OR_DEPARTMENT_OTHER): Payer: Self-pay | Admitting: Family Medicine

## 2025-01-07 NOTE — Progress Notes (Signed)
 Your A1c has improved significantly and is well-controlled at 5.8.  We consider your diabetes controlled at less than 7.  Keep up the good work.

## 2025-02-08 ENCOUNTER — Ambulatory Visit (HOSPITAL_BASED_OUTPATIENT_CLINIC_OR_DEPARTMENT_OTHER): Payer: Self-pay | Admitting: Certified Nurse Midwife

## 2025-02-09 ENCOUNTER — Ambulatory Visit (HOSPITAL_BASED_OUTPATIENT_CLINIC_OR_DEPARTMENT_OTHER): Admitting: Obstetrics and Gynecology

## 2025-07-06 ENCOUNTER — Encounter (HOSPITAL_BASED_OUTPATIENT_CLINIC_OR_DEPARTMENT_OTHER): Admitting: Family Medicine
